# Patient Record
Sex: Female | Born: 1940 | Race: White | Hispanic: No | Smoking: Never smoker
Health system: Southern US, Community
[De-identification: ages and names within clinical notes are randomized; demographics above are authoritative.]

## PROBLEM LIST (undated history)

## (undated) DIAGNOSIS — O1023 Pre-existing hypertensive chronic kidney disease complicating the puerperium: Secondary | ICD-10-CM

## (undated) DIAGNOSIS — K56609 Unspecified intestinal obstruction, unspecified as to partial versus complete obstruction: Secondary | ICD-10-CM

## (undated) DIAGNOSIS — I129 Hypertensive chronic kidney disease with stage 1 through stage 4 chronic kidney disease, or unspecified chronic kidney disease: Secondary | ICD-10-CM

## (undated) DIAGNOSIS — R059 Cough, unspecified: Secondary | ICD-10-CM

## (undated) DIAGNOSIS — N189 Chronic kidney disease, unspecified: Secondary | ICD-10-CM

## (undated) DIAGNOSIS — J45909 Unspecified asthma, uncomplicated: Secondary | ICD-10-CM

## (undated) DIAGNOSIS — M1990 Unspecified osteoarthritis, unspecified site: Secondary | ICD-10-CM

## (undated) DIAGNOSIS — I10 Essential (primary) hypertension: Secondary | ICD-10-CM

## (undated) DIAGNOSIS — F0390 Unspecified dementia without behavioral disturbance: Secondary | ICD-10-CM

## (undated) HISTORY — PX: JOINT REPLACEMENT: SHX530

---

## 2001-03-12 NOTE — ED Provider Notes (Signed)
[facility]                      EMERGENCY DEPARTMENT TREATMENT REPORT   NAME:  [name], [name]   MR #:  [id number]   BILLING #: [id number]        DOS: 03/12/2001  TIME:12:49 P   cc:   Primary Physician:  Alba Cory, M.D.   CHIEF COMPLAINT:  Chest pain with headache.   HISTORY OF PRESENT ILLNESS: This 60 year old white female presents at 12:10   complaining of chest pain that was constant, Monday and Tuesday, described   as pressure at 7/10 with dizziness, diaphoresis, and nausea.  She had seen   her doctor on Monday morning, blood pressure was still high. She was placed   on atenolol and increased her Diovan to two a day. The patient developed   chest pain later that day, no worse with deep inspiration or exertion. She   did have some shortness of breath also.   The pressure became intermittent   after Tuesday, and she notes no modifying factors whatsoever. She also   denies radiation, vomiting, hemoptysis or any other symptoms or injuries.   REVIEW OF SYSTEMS:   CONSTITUTIONAL:  No fever, chills, or weight loss.   HEMATOLOGIC/LYMPHATIC:  No excessive bruising or lymph node swelling.   ALLERGIC/IMMUNOLOGIC:  No urticaria or allergy symptoms.   RESPIRATORY:  No cough, shortness of breath, or wheezing.   CARDIOVASCULAR: The patient denies shortness of breath, dizziness,   diaphoresis, N/V, radiation, paresthesias, hemoptysis, productive cough,   calf symptoms, or loss of consciousness, except as above.   GASTROINTESTINAL:  No vomiting, diarrhea, or abdominal pain.   GENITOURINARY:  No dysuria, frequency, or urgency.   MUSCULOSKELETAL:  No joint pain or swelling.   INTEGUMENTARY:  No rashes.   NEUROLOGICAL:  The patient denies confusion, ataxia, weakness, radiation,   paresthesias, changed vision/speech/hearing, dizziness, loss of   consciousness, or N/V, except as above.   All other systems negative.   PAST MEDICAL HISTORY:  Hypertension, depression, status post knee    replacement.  Negative for MI, diabetes, or high cholesterol.   FAMILY HISTORY:  Negative early cardiac disease.   SOCIAL HISTORY:  No cigarettes.   ALLERGIES:  None.   MEDICATIONS:  Atenolol, Diovan, trazodone, Prozac and estrogen.   VITAL SIGNS:  Blood pressure 179/86, pulse 54, respirations 16, temperature   98.3 .  Pain 7/10 when present.   GENERAL APPEARANCE:  The patient appears well-developed and well-nourished.   Appearance and behavior are age and situation appropriate.   EYES: Conjunctivae clear, lids normal.  Pupils equal, symmetrical, and   normally reactive. Fundi:  Optic discs are normal in appearance; no gross   hemorrhages or exudates seen.   NECK:  Supple, nontender, symmetrical, no masses or JVD, trachea midline,   thyroid not enlarged, nodular, or tender.   RESPIRATORY:  Clear and equal breath sounds.  No respiratory distress,   tachypnea, or accessory muscle use.   HEART:  Regular, without significant murmurs, gallops, rubs, or thrills.   PMI not displaced. Vascular:  Calves soft and nontender.  No peripheral   edema or significant varicosities.  Carotid, femoral, and pedal pulses are   satisfactory.  The abdominal aorta is not palpably enlarged.   CHEST:  Chest symmetrical without masses or tenderness.   GASTROINTESTINAL:  Abdomen soft, nontender, without complaint of pain to   palpation.  No hepatomegaly or splenomegaly. No abdominal or  inguinal   masses appreciated by inspection or palpation.   SKIN:  Warm and dry without rashes.   NEUROLOGIC:  Cranial nerves, deep tendon reflexes, strength, and light   touch sensation are unremarkable.   PSYCHIATRIC:  Judgment appears appropriate. Recent and remote memory appear   to be intact. Oriented to time, place, and person.  Mood and affect   appropriate.   IMPRESSION/MANAGEMENT PLAN:  This is a new problem for this patient. As an   acute illness posing a potential threat to life or bodily function, this is    a high risk presentation necessitating an immediate diagnostic evaluation.   Nursing notes were reviewed.  Patient with chest pain.  Acute ischemic   coronary disease must be considered first, and the patient protected   against the consequences of same, while other etiologies (including   infectious, metabolic, pulmonary, gastrointestinal, and musculoskeletal)   are considered.   The patient states she always gets these headaches when her blood pressure   is up, states it is nothing unusual.   CONTINUATION BY DR. MANOLIO:   DIAGNOSTIC STUDIES:   X-ray was read as negative by the radiologist. An   electrocardiogram showed sinus bradycardia at 50 per minute, no ischemic   changes.  A CBC, BMP, and cardiac enzymes were all normal.   EMERGENCY DEPARTMENT COURSE: The patient was watched on cardiac, blood   pressure and oximetry monitoring and remained in sinus rhythm throughout   her stay.  A saline lock was established and the patient was give Clonidine   0.1 milligrams orally for her blood pressure.   On recheck at 0220 her blood pressure is 117/63, she is asymptomatic   completely, and is ready to go home.  I contacted Dr. Ulla Potash and she   agrees with discharge with the patient calling the office for further   rechecks of her blood pressure and should she have any worsening.   FINAL DIAGNOSIS:   1.  Poorly controlled hypertension.   2.   Chest pain, etiology unclear.   DISPOSITION:  The patient is discharged home in stable condition, with   instructions to follow up with their regular doctor.  They are advised to   return immediately for any worsening or symptoms of concern.   Electronically Signed By:   Imogene Burn, M.D. 03/15/2001 00:24   ____________________________   Imogene Burn, M.D.   ksf/dd  D:  03/12/2001 T:  03/12/2001 12:47 P   [id number]

## 2004-02-08 NOTE — ED Provider Notes (Signed)
[facility]                      EMERGENCY DEPARTMENT TREATMENT REPORT   NAME:  [name], [name]                       PT. LOCATION:     ER  [phone]   MR #:         BILLING #: [id number]          DOS: 02/08/2004   TIME: 1:07 A   [id number]   cc:    JACK L. Henrene Hawking, M.D.          Alba Cory, M.D.   Primary Physician:   CHIEF COMPLAINT:  Knee replacement now with dizziness.   HISTORY OF PRESENT ILLNESS:  The patient is a 63 year old female who had a   right knee replacement done at [facility] April 20 by   Dr. Nicholos Johns.  The patient has been followed by home health nurse at   home.  She noticed the last 2 days she has decreased appetite, feeling   nauseated at times, and is feeling hot, cold, and chills.  Felt lightheaded   when she got up and thought she was going to pass out.  She had an anxiety   attack because of that.  EMS was called.  The patient was transported here.   The patient had a bowel movement today after being constipated for 4 days   after she took magnesium citrate.  She did not see any blood or melena.   PAST MEDICAL HISTORY:  Hypertension, depression, and the left knee was   replaced in the past.   ALLERGIES:  Morphine, sulfate.   MEDICATIONS:  Atenolol, warfarin, hydrochlorothiazide, Diovan, Desyrel,   Carafate, multivitamins.   SOCIAL HISTORY:  Lives at home.   FAMILY HISTORY:  Noncontributory.   REVIEW OF SYSTEMS: ENT: No sore throat, runny nose or other URI symptoms.   HEMATOLOGIC/LYMPHATIC:  No excessive bruising or lymph node swelling.   RESPIRATORY:  No cough, shortness of breath, or wheezing.   CARDIOVASCULAR:  No chest pain, chest pressure, or palpitations.   GASTROINTESTINAL:  No vomiting, diarrhea, or abdominal pain.   GENITOURINARY:  No dysuria, frequency, or urgency.   MUSCULOSKELETAL:  Still has some joint pain from the surgery.   INTEGUMENTARY:  No rashes.   NEUROLOGICAL:  No headaches, sensory or motor symptoms.    Denies complaints in any other system.   PHYSICAL EXAMINATION:   VITAL SIGNS:  Blood pressure 159/75, pulse 76, respirations 18, temperature   93.  The patient stood up, and his blood pressure remained about the same   with minimal change in pulse.  She is not toxic and not dehydrated.   RESPIRATORY:  Clear and equal breath sounds.  No respiratory distress,   tachypnea, or accessory muscle use.   CARDIOVASCULAR:  Heart regular, without murmurs, gallops, rubs, or thrills.   PMI not displaced.   CHEST:  Chest symmetrical without masses or tenderness.   GI:  Abdomen soft, nontender, without complaint of pain to palpation.  No   hepatomegaly or splenomegaly.  No abdominal or inguinal masses appreciated   by inspection or palpation.   Rectal:  No masses or hemorrhoids.  Sphincter tone is normal.  Stool brown,   guaiac negative.   NEUROLOGIC:  Cranial nerves, deep tendon reflexes, strength, and light   touch sensation are unremarkable.  NECK:  Supple.  No meningeal signs.  Sinuses nontender.   PSYCHIATRIC:  Judgment appears appropriate.  Recent and remote memory   appear to be intact. Oriented to time, place and person.  Mood and affect   appropriate.   SKIN:  Shows no rash.  Incision healing well.  No effusion noted.   INITIAL ASSESSMENT AND MANAGEMENT PLAN:  The patient presents here with   feeling lightheaded after recent surgery and will be evaluated for comorbid   complications of this.  She does not have a fever but felt warm earlier.   Nursing notes were reviewed.  Old records reviewed.   CONTINUATION BY DR. KISA:   DIAGNOSTIC TESTING:  PT/INR was 1.6.  Urinalysis negative for infection,   less than 1.005 showing good hydration and specific gravity.  BMP within   normal limits.  EKG showed normal sinus rhythm, no axis deviation, and no   acute abnormalities.  Chest x-ray was negative per Dr. Arvella Merles.  White count   is 9.8, hematocrit 30, and hemoglobin 10.    COURSE IN THE EMERGENCY DEPARTMENT:  The patient was stood up and was not   orthostatic.  She was hydrated here further.  After she drank fluids and   ate a meal, she started feeling fine and denied being dizzy.  She revealed   that today she has only had a couple pieces of toast in the morning along   with some Jell-O in the evening.  She says she just does not have an   appetite.  We emphasized to her that she does need to eat regular meals to   maintain her caloric intake and help in her rehabilitation.  She has a home   health nurse who checks on her regularly.  They will be able to follow with   her.  At this time, she was discharged with a ride expected to arrive here   shortly.   CLINICAL IMPRESSION:      1.  Acute dizziness evaluation.      2. Status post recent surgery with right knee replacement.  The patient      at this time denies any chest pain, shortness of breath, swelling,      edema, or fever.      3. Other past medical problems as noted above.   Electronically Signed By:   Wetzel Bjornstad Arvella Merles, M.D. 02/08/2004 18:26   ____________________________   Wetzel Bjornstad. Arvella Merles, M.D.   mw/gm  D:  02/08/2004  T:  02/08/2004  6:18 A   [id number]

## 2005-07-18 NOTE — Op Note (Signed)
[facility]                                OPERATION REPORT                         SURGEON:  Vista Lawman, M.D.   [facility] Sch[place], C[place] G   E:   MR  [id number]                         DATE:            07/18/2005   #:   Lindley Magnus  [id number]                      PT. LOCATION:   #   PAT Brown Human, M.D.   cc:    Vista Lawman, M.D.   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   POSTOPERATIVE DIAGNOSIS:   Same as above.   PROCEDURE:   Arthrodesis of metacarpophalangeal joint, right thumb.   SURGEON:   Burnell Blanks, M.D.   ANESTHESIA:   General endotracheal anesthesia by [facility]   DESCRIPTION OF PROCEDURE:  After adequate anesthesia was obtained, the   patient's right arm was prepped and draped in the usual sterile fashion.   After exsanguination with an Esmarch, the tourniquet was inflated to 250   mm/HG.  At this point, an incision was made over the metacarpophalangeal   joint and was carried down through subcutaneous tissue.  The extensor hood   was split.  The dorsal capsule was opened and the joint was markedly   arthritic.  The collateral ligaments were released.  The joint was flexed   down.  The high speed bur was then used to decorticate the distal aspect of   the first metacarpal and the proximal aspect of the proximal phalanx.  A   [facility] arthrodesis was then performed with two 0.35 K-wires. The thumb   was straight in regards to varus and valgus in about 5-10 degrees of   flexion.  Clinically, it looked excellent.  The wound was then irrigated   out and the pins were cut on bone.  The capsule was closed with 3-0   Ethibond.  The extensor hood was placed with 3-0 Ethibond.  Tourniquet had   been deflated at 28 minutes.  Hemostasis was obtained. The   skin was closed with 5-0 Monocryl sutures.  A sterile dressing with a thumb   Spica splint was applied  and the patient was taken from OR to the recovery   room in good condition.   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   Electronically Signed By:   Vista Lawman, M.D. 07/18/2005 09:43   _________________________________   Vista Lawman, M.D.   ecc  D:  07/18/2005  T:  07/18/2005  8:49 A   [id number]

## 2005-07-18 NOTE — Op Note (Signed)
[facility]                                OPERATION REPORT                         SURGEON:  Vista Lawman, M.D.   [facility] [place], [place] G   E:   MR  [id number]                         DATE:            07/18/2005   #:   Lindley Magnus  [id number]                      PT. LOCATION:   #   PAT Brown Human, M.D.   cc:    Vista Lawman, M.D.   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   POSTOPERATIVE DIAGNOSIS:   Same as above.   PROCEDURE:   Arthrodesis of metacarpophalangeal joint, right thumb.   SURGEON:   Burnell Blanks, M.D.   ANESTHESIA:   General endotracheal anesthesia by [facility]   DESCRIPTION OF PROCEDURE:  After adequate anesthesia was obtained, the   patient's right arm was prepped and draped in the usual sterile fashion.   After exsanguination with an Esmarch, the tourniquet was inflated to 250   mm/HG.  At this point, an incision was made over the metacarpophalangeal   joint and was carried down through subcutaneous tissue.  The extensor hood   was split.  The dorsal capsule was opened and the joint was markedly   arthritic.  The collateral ligaments were released.  The joint was flexed   down.  The high speed bur was then used to decorticate the distal aspect of   the first metacarpal and the proximal aspect of the proximal phalanx.  A   [facility]-[employer]-[facility] arthrodesis was then performed with two 0.35 K-wires. The thumb   was straight in regards to varus and valgus in about 5-10 degrees of   flexion.  Clinically, it looked excellent.  The wound was then irrigated   out and the pins were cut on bone.  The capsule was closed with 3-0   Ethibond.  The extensor hood was placed with 3-0 Ethibond.  Tourniquet had   been deflated at 28 minutes.  Hemostasis was obtained. The   skin was closed with 5-0 Monocryl sutures.  A sterile dressing with a thumb    Spica splint was applied and the patient was taken from OR to the recovery   room in good condition.   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   Electronically Signed By:   Vista Lawman, M.D. 07/18/2005 09:43   _________________________________   Vista Lawman, M.D.   ecc  D:  07/18/2005  T:  07/18/2005  8:49 A   [id number]

## 2008-08-03 NOTE — Progress Notes (Signed)
[facility]                       PHYSICAL THERAPY INITIAL EVALUATION   PATIENT NAME:  [name], [name]   MR#:  [id number]   DATE:  08/02/2008   BILLING#  [id number]   REFERRING PHYSICIAN:  Princess Perna, DPM   HCFA 700: MEDICARE PLAN OF TREATMENT FOR OUTPATIENT REHABILITATION   PATIENT'S NAME:                 [name]#    HICN   [name], [name]                [id number]       [id number]   TYPE  x PT       OT       SLP                 ONSET: 06/09      SOC: 10/27/0   PRIMARY DIAGNOSIS                     TREATMENT DIAGNOSIS   Bilateral plantar fasciitis           Same   I CERTIFY THE NEED FOR THESE SERVICES        FREQ/DURATION (E.G.3/WK X 4 WK)   FURNISHED UNDER THIS PLAN OF TREATMENT AND   WHILE UNDER MY CARE                          3 X WEEK X    4       WEEKS                                                CERTIFICATION   Please   Sign                                         FROM   Here                                         THROUGH                                                08/02/08                                                11/02/08   PHYSICIAN SIGNATURE:                         PRIOR HOSPITALIZATION, IF   DATE:                                        APPLICABLE  FROM:                         T   PLEASE SIGN AND RETURN PAGE 1 ONLY VIA FAX TO [phone]   SUBJECTIVE   HISTORY OF PRESENT ILLNESS   This patient reports she developed an onset of pain in her heels   approximately 3 months ago.  It initially started with the right heel and   is now affecting the left heel as well.  She states she usually wears very   good footwear in general and has been wearing tennis shoes lately, but it   does not seem to help.  She states her pain increases with excessive   walking and is unable to work out at [employer] like she used to.  She states   she does have custom made orthotics, but is not wearing them on a    consistent basis.  The podiatrist has suggested steroid injections in the   heels, but she wants to put them off as long as possible.   PAIN SCALE RATING   1/10 at best, which is with nonweightbearing, 5-6/10 at worst.   MEDICATIONS   Atenolol, Norvasc, Micardis, Prozac, multivitamin, and Tylenol p.m.   ALLERGIES   None listed.   PRECAUTIONS   None specified on prescription.   PAST MEDICAL HISTORY   Significant for hypertension, rheumatoid arthritis, kidney problems,   bilateral total knee arthroplasties,  bilateral hallux valgus correction   and bunionectomies.   X-RAYS/TESTS   None specified.   OCCUPATIONAL/SOCIAL HISTORY   The patient is retired and enjoys working out/exercising and gardening.   PREVIOUS PHYSICAL THERAPY   None.   PATIENT'S GOALS   To walk and exercise without pain.   OBJECTIVE   RANGE OF MOTION   Bilateral talocrural active range of motion visibly within normal limits.   There is limited great toe extension bilaterally secondary to hallux valgus   correction with pin placement, left greater than right.   PALPATION   Tender plantar fascia attachments at the medial calcaneal tubercles.   POSTURE   In standing: Bilateral flat feet with fallen naviculars are noted, left   greater than right.  Subtalar pronation is also noted.   GAIT   Antalgic.   TODAY'S TREATMENT   Initial evaluation, followed by passive range of motion and stretching to   the calf musculature, plantar fascia and first MTP joints.  Instructed in a   home exercise program consisting of gastroc soleus stretches with a towel,   passive range of motion to the first MTP joints and intrinsic strengthening   via towel crunches.  An illustrated handout was issued.  Also discussed the   importance of compliance with her custom orthotics for arch support.   ASSESSMENT   PROBLEMS: The patient is a 67 year old female with bilateral foot and heel   pain secondary to plantar fasciitis.   GOALS:  To be achieved in 12 visits.    1. Independent with home exercise program and self treatment techniques.   2. Decrease subjective complaints of calcaneal and arch pain by greater      than or equal to 4 decrements.   3. Minimize palpable tenderness of the plantar fascia and its insertion.   REHAB POTENTIAL: Fair.   BARRIERS TO ACHIEVING GOALS: Hypomobility of the toes, especially the first   MTP joints from previous surgeries may hinder her progress.   PLAN: The patient to be  seen by a physical therapist or licensed physical   therapy assistant 3 x a week x 4 weeks.  Treatment to include ultrasound,   iontophoresis, manual therapy, cryotherapy and therapeutic exercise.   Thank you very much for this referral Dr. Dory Peru.  Should you have any   questions or concerns regarding the care of this patient please feel free   to contact me at [phone].   Sincerely,   Unreviewed - Pending Final Signature   CATHERINE JOSEPH, MPT   sl  D:  08/03/2008  T:  08/04/2008  9:24 A   [id number]

## 2008-08-24 LAB — METABOLIC PANEL, COMPREHENSIVE
A-G Ratio: 1 (ref 0.8–1.7)
ALT (SGPT): 37 U/L (ref 30–65)
AST (SGOT): 25 U/L (ref 15–37)
Albumin: 3.9 g/dL (ref 3.4–5.0)
Alk. phosphatase: 90 U/L (ref 50–136)
Anion gap: 8 mmol/L (ref 5–15)
BUN/Creatinine ratio: 16 (ref 12–20)
BUN: 19 MG/DL — ABNORMAL HIGH (ref 7–18)
Bilirubin, total: 0.4 MG/DL (ref 0.1–0.9)
CO2: 31 MMOL/L (ref 21–32)
Calcium: 10 MG/DL (ref 8.4–10.4)
Chloride: 96 MMOL/L — ABNORMAL LOW (ref 100–108)
Creatinine: 1.2 MG/DL (ref 0.6–1.3)
GFR est AA: 57 mL/min/{1.73_m2} — ABNORMAL LOW (ref >60)
GFR est non-AA: 47 mL/min/{1.73_m2} — ABNORMAL LOW (ref >60)
Globulin: 3.8 g/dL (ref 2.0–4.0)
Glucose: 98 MG/DL (ref 74–99)
Potassium: 4.1 MMOL/L (ref 3.5–5.5)
Protein, total: 7.7 g/dL (ref 6.4–8.2)
Sodium: 135 MMOL/L — ABNORMAL LOW (ref 136–145)

## 2008-08-25 LAB — VITAMIN D, 25 HYDROXY: Vitamin D 25-Hydroxy: 46 ng/mL (ref 30–80)

## 2009-02-23 LAB — CBC WITH AUTOMATED DIFF
ABS. EOSINOPHILS: 0.1 10*3/uL (ref 0.0–0.4)
ABS. LYMPHOCYTES: 3.7 10*3/uL — ABNORMAL HIGH (ref 0.8–3.5)
ABS. MONOCYTES: 0.6 10*3/uL (ref 0–1.0)
ABS. NEUTROPHILS: 5.7 10*3/uL (ref 1.8–8.0)
BASOPHILS: 0 % (ref 0–3)
EOSINOPHILS: 1 % (ref 0–5)
HCT: 36.7 % (ref 36.0–46.0)
HGB: 12.2 g/dL (ref 12.0–16.0)
LYMPHOCYTES: 37 % (ref 20–51)
MCH: 33.9 PG (ref 25.0–35.0)
MCHC: 33.2 g/dL (ref 31.0–37.0)
MCV: 102.2 FL — ABNORMAL HIGH (ref 78.0–102.0)
MONOCYTES: 6 % (ref 2–9)
MPV: 10.5 FL — ABNORMAL HIGH (ref 7.4–10.4)
NEUTROPHILS: 56 % (ref 42–75)
PLATELET: 216 10*3/uL (ref 130–400)
RBC: 3.6 M/uL — ABNORMAL LOW (ref 4.10–5.10)
RDW: 13.4 % (ref 11.5–14.5)
WBC: 10.1 10*3/uL (ref 4.5–13.0)

## 2009-02-23 LAB — METABOLIC PANEL, COMPREHENSIVE
A-G Ratio: 1 (ref 0.8–1.7)
ALT (SGPT): 35 U/L (ref 30–65)
AST (SGOT): 22 U/L (ref 15–37)
Albumin: 3.9 g/dL (ref 3.4–5.0)
Alk. phosphatase: 77 U/L (ref 50–136)
Anion gap: 10 mmol/L (ref 5–15)
BUN/Creatinine ratio: 22 — ABNORMAL HIGH (ref 12–20)
BUN: 28 MG/DL — ABNORMAL HIGH (ref 7–18)
Bilirubin, total: 0.3 MG/DL (ref 0.1–0.9)
CO2: 30 MMOL/L (ref 21–32)
Calcium: 10.5 MG/DL — ABNORMAL HIGH (ref 8.4–10.4)
Chloride: 100 MMOL/L (ref 100–108)
Creatinine: 1.3 MG/DL (ref 0.6–1.3)
GFR est AA: 52 mL/min/{1.73_m2} — ABNORMAL LOW (ref >60)
GFR est non-AA: 43 mL/min/{1.73_m2} — ABNORMAL LOW (ref >60)
Globulin: 3.9 g/dL (ref 2.0–4.0)
Glucose: 96 MG/DL (ref 74–99)
Potassium: 4.8 MMOL/L (ref 3.5–5.5)
Protein, total: 7.8 g/dL (ref 6.4–8.2)
Sodium: 140 MMOL/L (ref 136–145)

## 2009-02-23 LAB — AMYLASE: Amylase: 62 U/L (ref 25–115)

## 2009-02-23 LAB — TSH 3RD GENERATION: TSH: 0.66 u[IU]/mL (ref 0.51–6.27)

## 2009-02-24 LAB — VITAMIN D, 25 HYDROXY: Vitamin D 25-Hydroxy: 49 ng/mL (ref 30–80)

## 2009-05-31 LAB — CBC WITH AUTOMATED DIFF
ABS. LYMPHOCYTES: 3.5 10*3/uL (ref 0.8–3.5)
ABS. MONOCYTES: 0.7 10*3/uL (ref 0–1.0)
ABS. NEUTROPHILS: 4.1 10*3/uL (ref 1.8–8.0)
BASOPHILS: 1 % (ref 0–3)
EOSINOPHILS: 2 % (ref 0–5)
HCT: 36 % (ref 36.0–46.0)
HGB: 11.9 g/dL — ABNORMAL LOW (ref 12.0–16.0)
LYMPHOCYTES: 41 % (ref 20–51)
MCH: 33.6 PG (ref 25.0–35.0)
MCHC: 33 g/dL (ref 31.0–37.0)
MCV: 101.5 FL (ref 78.0–102.0)
MONOCYTES: 9 % (ref 2–9)
MPV: 9.6 FL (ref 7.4–10.4)
NEUTROPHILS: 47 % (ref 42–75)
PLATELET: 208 10*3/uL (ref 130–400)
RBC: 3.54 M/uL — ABNORMAL LOW (ref 4.10–5.10)
RDW: 13.7 % (ref 11.5–14.5)
WBC: 8.5 10*3/uL (ref 4.5–13.0)

## 2009-05-31 LAB — METABOLIC PANEL, COMPREHENSIVE
A-G Ratio: 1.1 (ref 0.8–1.7)
ALT (SGPT): 29 U/L — ABNORMAL LOW (ref 30–65)
AST (SGOT): 21 U/L (ref 15–37)
Albumin: 4 g/dL (ref 3.4–5.0)
Alk. phosphatase: 88 U/L (ref 50–136)
Anion gap: 8 mmol/L (ref 5–15)
BUN/Creatinine ratio: 22 — ABNORMAL HIGH (ref 12–20)
BUN: 29 MG/DL — ABNORMAL HIGH (ref 7–18)
Bilirubin, total: 0.4 MG/DL (ref 0.1–0.9)
CO2: 29 MMOL/L (ref 21–32)
Calcium: 10.2 MG/DL (ref 8.4–10.4)
Chloride: 100 MMOL/L (ref 100–108)
Creatinine: 1.3 MG/DL (ref 0.6–1.3)
GFR est AA: 52 mL/min/{1.73_m2} — ABNORMAL LOW (ref >60)
GFR est non-AA: 43 mL/min/{1.73_m2} — ABNORMAL LOW (ref >60)
Globulin: 3.7 g/dL (ref 2.0–4.0)
Glucose: 86 MG/DL (ref 74–99)
Potassium: 4.2 MMOL/L (ref 3.5–5.5)
Protein, total: 7.7 g/dL (ref 6.4–8.2)
Sodium: 137 MMOL/L (ref 136–145)

## 2009-05-31 LAB — LIPID PANEL
CHOL/HDL Ratio: 2.3 (ref 0–5.0)
Cholesterol, total: 184 MG/DL (ref 0–200)
HDL Cholesterol: 80 MG/DL — ABNORMAL HIGH (ref 40–60)
LDL, calculated: 83.4 MG/DL (ref 0–100)
LDL/HDL Ratio: 1
Triglyceride: 103 MG/DL (ref 0–150)
VLDL, calculated: 20.6 MG/DL

## 2009-05-31 LAB — URINALYSIS W/ RFLX MICROSCOPIC
Bilirubin: NEGATIVE
Blood: NEGATIVE
Glucose: NEGATIVE MG/DL
Ketone: NEGATIVE MG/DL
Leukocyte Esterase: NEGATIVE
Nitrites: NEGATIVE
Protein: NEGATIVE MG/DL
Specific gravity: 1.015 (ref 1.003–1.030)
Urobilinogen: 0.2 EU/DL (ref 0.2–1.0)
pH (UA): 7 (ref 5.0–8.0)

## 2009-06-01 LAB — VITAMIN D, 25 HYDROXY: Vitamin D 25-Hydroxy: 40 ng/mL (ref 30–80)

## 2010-08-13 LAB — TYPE AND SCREEN
ABO/Rh: O POS
Antibody Screen: NEGATIVE

## 2010-08-13 LAB — URINALYSIS W/ RFLX MICROSCOPIC
Bilirubin: NEGATIVE
Blood: NEGATIVE
Glucose: NEGATIVE MG/DL
Ketone: NEGATIVE MG/DL
Leukocyte Esterase: NEGATIVE
Nitrites: NEGATIVE
Protein: NEGATIVE MG/DL
Specific gravity: 1.015 (ref 1.003–1.030)
Urobilinogen: 0.2 EU/DL (ref 0.2–1.0)
pH (UA): 7 (ref 5.0–8.0)

## 2010-08-13 LAB — METABOLIC PANEL, COMPREHENSIVE
A-G Ratio: 1.1 (ref 0.8–1.7)
ALT (SGPT): 40 U/L (ref 30–65)
AST (SGOT): 22 U/L (ref 15–37)
Albumin: 4 g/dL (ref 3.4–5.0)
Alk. phosphatase: 85 U/L (ref 50–136)
Anion gap: 4 mmol/L — ABNORMAL LOW (ref 5–15)
BUN/Creatinine ratio: 17 (ref 12–20)
BUN: 20 MG/DL — ABNORMAL HIGH (ref 7–18)
Bilirubin, total: 0.5 MG/DL (ref 0.2–1.0)
CO2: 33 MMOL/L — ABNORMAL HIGH (ref 21–32)
Calcium: 10 MG/DL (ref 8.4–10.4)
Chloride: 100 MMOL/L (ref 100–108)
Creatinine: 1.2 MG/DL (ref 0.6–1.3)
GFR est AA: 57 mL/min/{1.73_m2} — ABNORMAL LOW (ref >60)
GFR est non-AA: 47 mL/min/{1.73_m2} — ABNORMAL LOW (ref >60)
Globulin: 3.8 g/dL (ref 2.0–4.0)
Glucose: 88 MG/DL (ref 74–99)
Potassium: 3.9 MMOL/L (ref 3.5–5.5)
Protein, total: 7.8 g/dL (ref 6.4–8.2)
Sodium: 137 MMOL/L (ref 136–145)

## 2010-08-13 LAB — CBC W/O DIFF
HCT: 37.4 % (ref 35.0–45.0)
HGB: 12.4 g/dL (ref 12.0–16.0)
MCH: 32.8 PG (ref 24.0–34.0)
MCHC: 33.2 g/dL (ref 31.0–37.0)
MCV: 98.9 FL — ABNORMAL HIGH (ref 74.0–97.0)
MPV: 12.2 FL — ABNORMAL HIGH (ref 9.2–11.8)
PLATELET: 242 10*3/uL (ref 135–420)
RBC: 3.78 M/uL — ABNORMAL LOW (ref 4.20–5.30)
RDW: 12.9 % (ref 11.6–14.5)
WBC: 9.3 10*3/uL (ref 4.6–13.2)

## 2010-08-13 LAB — PROTHROMBIN TIME + INR
INR: 0.9 (ref 0.0–1.2)
Prothrombin time: 12.2 s (ref 11.5–15.2)

## 2010-08-13 LAB — TYPE & SCREEN
ABO/Rh(D): O POS
Antibody screen: NEGATIVE

## 2010-08-13 LAB — PTT: aPTT: 25 s (ref 24.6–37.7)

## 2010-08-22 NOTE — Op Note (Unsigned)
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    PROCEDURE PERFORMED  1. Right L5-S1 laminotomy and facetectomy with removal of synovial cyst.  2. Posterolateral L5-S1 fusion using local autograft and Osteocel Plus  allograft mixture.  3. Spinous process plating, L5-S1.  4. Fluoroscopy for localization and instrumentation placement.    BRIEF HISTORY: The patient is a 69 year old woman who I have been seeing in  my office for severe right posterior leg pain. Her imaging study showed a  large synovial cyst at the L5-S1 level on the right side causing severe  nerve root compression. I recommended surgical resection of this cyst and  likely posterolateral fusion due to the amount of facet necessary to be  resected in order to completely remove the cyst. The patient was aware of  the risks and benefits of the procedure and wished to proceed.    PROCEDURE IN DETAIL: The patient was brought to the operating room and  general anesthesia was induced without any difficulty. Following the  induction of anesthesia, she was placed prone on a Jackson table and  positioned appropriately for the procedure. Appropriate antibiotic had been  given, and this was confirmed.    The lumbar area was marked with fluoroscopy and an incision was planned.  This area was prepped and draped in a sterile fashion and injected with 1%  lidocaine containing epinephrine. The lumbar midline incision was then made  and extended down to the lumbosacral fascia. A bilateral takedown of the  lumbar paraspinous musculature was done at L5-S1 using monopolar   electrocautery and Cobb elevators. Self-retaining retractors were placed.  On the right side, the muscular takedown was performed far laterally over  the facet joint.    The diseased facet was easily visually identified and at this point, the  laminotomy and facetectomy portion of the procedure began. An  L5-S1 laminotomy with total facetectomy was performed using a combination  of high-speed drill and Kerrison rongeurs. A large fluid-filled synovial  cyst was identified. The cyst was popped and returned some viscous fluid.  The cyst capsule was then dissected off of the S1 nerve root and thecal sac  using a careful microdissection. All bone around the cyst was also removed  to address any possible compressive elements.    After good decompression had been achieved, the stability of the segment  was assessed using towel clips on the spinous processes. There was some  instability introduced by resection of the facet and therefore, I felt it  was most likely in the patient's best interest to stabilize the segment as  best possible. We had talked about this eventuality preoperatively, and we  very much wanted to avoid having to do a pedicle screw fusion on her.  Therefore, we opted for a spinous process plate construct.    On the left side, the lamina was decorticated with a high-speed drill. The  bone harvested from the decompression on the right side was then mixed with  Osteocel Plus bone growth adjunct and packed over this decorticated area.  An interspinous process plate was then placed between L5  and S1 and locked  into place. A secure fit was achieved. Positioning was checked with AP and  lateral fluoroscopy and found to be good.    At this point, the wound was copiously irrigated and injected with 0.25%  plain Marcaine local anesthetic. The wound was closed with a layer of 0  Vicryl sutures, followed by 2-0 and 3-0 Vicryl subcutaneous sutures, and a   running 4-0 Monocryl subcuticular stitch. The incision was cleaned and  dried, and a sterile dressing was applied. The patient was taken to  postanesthesia care unit for recovery in good condition.             Date:______Time:______Signature________________________________   Nikhil Osei Margit Banda, MD    JJL:wmx  D: 08/22/2010 9:45 A T: 08/22/2010 5:57 P  Job#: [id number] CScriptDoc #: [id number]  cc: Anthoni Geerts Margit Banda, MD

## 2010-08-25 NOTE — Discharge Summary (Unsigned)
[facility]   [address], [place], [place] [address]     DISCHARGE SUMMARY    PATIENT: [name], [name]  MRN: [id number] ADMITTED: 08/22/2010  BILLING: [id number] DISCHARGED: 08/25/2010  ATTENDING: Louetta Hollingshead Margit Banda, MD  DICTATING: Ernesteen Mihalic Margit Banda, MD        PRINCIPAL DISCHARGE DIAGNOSES  1. Status post right L5 to S1 foraminotomy, facetectomy, and fusion.  2. Right L5 to S1 synovial cyst with root compression.  3. Constipation.  4. Hypertension.    ADDITIONAL DIAGNOSES  1. Prior history of left rotator cuff repair in 2001.  2. Cervical spondylosis without myelopathy.    ALLERGIES: NO KNOWN DRUG ALLERGIES.    PRINCIPAL PROCEDURES PERFORMED DURING THIS HOSPITALIZATION  1. Right L5 to S1 laminotomy and facetectomy with removal of synovial  cyst.  2. Posterolateral L5 to S1 fusion using local autograft and Osteocel Plus  allograft mixture.  3. Spinous process plating, L5 to S1.  4. Fluoroscopy for localization and instrumentation placement.    HISTORY OF PRESENT ILLNESS: The patient is a very pleasant 69 year old  female, followed by Dr. Grace Isaac on an outpatient basis, for severe  right posterior leg pain. Imaging studies showed a large synovial cyst at  L5 to S1 level on the right side, causing severe nerve root compression.  Surgical resection of this cyst and likely posterolateral fusion were  recommended. Patient elected to undergo the above-named procedures.    HOSPITAL COURSE: The patient was admitted on 08/22/2010. The patient  underwent the above-named procedure. Patient tolerated procedure well,  remaining hemodynamically stable during her hospitalization, received  appropriate pre and postoperative prophylactic antibiotics, as well as  maintained on sequential compression devices. Patient's surgical incision  site remained clean, dry, and intact without any signs or symptoms of  infection; Steri-Strips clean, dry, and intact, open to air. Site flat,   dry, no hematoma, no signs or symptoms of infection. Patient is able to  ambulate approximately 300 feet, just prior to discharge, with a rolling  walker and Physical Therapy. Patient has bilateral 5/5 motor strength.  Currently with some complaints of constipation, currently on laxative  p.r.n., currently awaiting to have BM. Patient tolerating oral liquids and  diet easily. Voiding without difficulty.    DISCHARGE MEDICATIONS  1. Micardis 80/12.5 mg 1 tablet p.o. daily.  2. Atenolol 100 mg by mouth at bedtime.  3. Amlodipine 2.5 mg by mouth at bedtime.  4. Premarin 0.5 mg p.o. daily.  5. Prozac 20 mg p.o. daily.  6. Calcium plus vitamin D 600 mg 1 p.o. daily.  7. Vitamin D 50,000 units 1 p.o. once every week.  8. Percocet 5/325 mg 1 to 2 tablets p.o. every 4 hours p.r.n. pain.    DISCHARGE INSTRUCTIONS: Patient will be discharged to home, after she has  had a bowel movement. The patient will have Home Health physical therapy  and Home Health check. Patient instructed on lumbar precautions, no  bending, lifting, or twisting. Patient instructed on no tub baths; may  shower, may pat surgical site dry; do not rub. Instructed to follow up in  office in 4 weeks. Call with any questions or concerns regarding her care;  office number is [phone]; again, please call with any questions or  concerns regarding her care. Again, patient will have Home Health safety  check at home, as well as physical therapy.    DISPOSITION: Patient likely stable for discharge to home, with above  discharge instructions and medications  and followup care as dictated  above. Discharge assessment and plan made with Dr. Grace Isaac.        Dictated By: Melburn Popper, FNP-C               Date:______Time:______Signature________________________________   Baptiste Littler Margit Banda, MD    JJL:wmx  D: 08/24/2010 3:09 P T: 08/25/2010 2:21 P  Job #: [id number] CScriptDoc #: [id number]  cc: Maye Hides, MD   Kamayah Pillay Margit Banda, MD   Nicholos Johns

## 2011-07-18 NOTE — Procedures (Signed)
Acquisition Time: 2011-07-19  10:07:45   Total Exercise Time: 51 secs   Test Indications: CHEST PAIN   Medications:   Protocol: LEXISCAN           Max HR: 092 BPM  62% of  Pred: 148 BPM   Max BP: 154/084 mmHG   Max Work Load: 1.0 METS   Conclusion: Please Correlate with Nuclear Report:   Negative ECG Response to IV Regadenoson (Lexiscan)   Conclusion: Please Correlate with Nuclear Report:   Confirmed by Ashby, M.D., Charles Jr. (30) on 07/19/2011 1:53:55 PM   Referred By:             Overread By: Charles J    Ashby, M.D.

## 2011-07-19 NOTE — Procedures (Signed)
[facility]   Nuclear Report   NAME:  Herbster, Fannie   SEX:   F   DATE: 07/19/2011   DOB:   1941-04-18   MR# [id number]   ROOM:     REFERRING:    BILLING#  [id number]   Tommy Medal MD, FACC, FCCP               READ WITH DR. Hassan Rowan   NUCLEAR REPORT:   Tomographic nuclear imaging with attenuation correction and quantitative    analysis demonstrates no significant fixed or transient defects, comparing the    infusion stress study and rest images.  Gated stress study demonstrates    normal wall motion with 0.66 ejection fraction.       OVERALL IMPRESSION:   Normal ECG gated IV pharmacologic SPECT sestamibi nuclear stress test    demonstrates normal left ventricular wall motion and ejection fraction with no    scintigraphic evidence of myocardial scar or ischemia following regadenoson    infusion.           ___________________   Tommy Medal MD, FACC, FCCP   Dictated By: .    KB   D:07/19/2011   T: 07/20/2011 01:32:57   [id number]

## 2011-07-19 NOTE — Procedures (Signed)
Acquisition Time: 2011-07-19  10:07:45   Total Exercise Time: 51 secs   Test Indications: CHEST PAIN   Medications:   Protocol: LEXISCAN           Max HR: 092 BPM  62% of  Pred: 148 BPM   Max BP: 154/084 mmHG   Max Work Load: 1.0 METS   Conclusion: Please Correlate with Nuclear Report:   Negative ECG Response to IV Regadenoson Eugenie Birks)   Conclusion: Please Correlate with Nuclear Report:   Confirmed by Cleon Gustin, M.D., Madelynn Done. (30) on 07/19/2011 1:53:55 PM   Referred By:             Overread By: Merlene Pulling, M.D.

## 2011-07-19 NOTE — Procedures (Signed)
[facility]   Nuclear Report   NAME:  Trautman, Ashleigh   SEX:   F   DATE: 07/19/2011   DOB:   07/01/1941   MR# [id number]   ROOM:     REFERRING:    BILLING#  [id number]   Charles C Ashby MD, FACC, FCCP               READ WITH DR. ARNSTON   NUCLEAR REPORT:   Tomographic nuclear imaging with attenuation correction and quantitative    analysis demonstrates no significant fixed or transient defects, comparing the    infusion stress study and rest images.  Gated stress study demonstrates    normal wall motion with 0.66 ejection fraction.       OVERALL IMPRESSION:   Normal ECG gated IV pharmacologic SPECT sestamibi nuclear stress test    demonstrates normal left ventricular wall motion and ejection fraction with no    scintigraphic evidence of myocardial scar or ischemia following regadenoson    infusion.           ___________________   Charles C Ashby MD, FACC, FCCP   Dictated By: .    KB   D:07/19/2011   T: 07/20/2011 01:32:57   [id number]

## 2012-04-02 NOTE — Procedures (Signed)
Test Reason : Other Reasons-Enter in Comments   Blood Pressure : ***/*** mmHG   Vent. Rate : 054 BPM     Atrial Rate : 054 BPM      P-R Int : 144 ms          QRS Dur : 088 ms       QT Int : 442 ms       P-R-T Axes : 034 -02 001 degrees      QTc Int : 419 ms   Sinus bradycardia   Otherwise normal ECG   When compared with ECG of 11-Jul-2005 11:50,   PREVIOUS ECG IS PRESENT   No significant change since prior tracing   Confirmed by Hyacinth Meeker, M.D., Ramon Dredge (37) on 04/02/2012 5:51:50 PM   Referred By:             Overread By: Berton Redmond, M.D.

## 2012-04-02 NOTE — Procedures (Signed)
Test Reason : Other Reasons-Enter in Comments   Blood Pressure : ***/*** mmHG   Vent. Rate : 054 BPM     Atrial Rate : 054 BPM      P-R Int : 144 ms          QRS Dur : 088 ms       QT Int : 442 ms       P-R-T Axes : 034 -02 001 degrees      QTc Int : 419 ms   Sinus bradycardia   Otherwise normal ECG   When compared with ECG of 11-Jul-2005 11:50,   PREVIOUS ECG IS PRESENT   No significant change since prior tracing   Confirmed by Miller, M.D., Edward (37) on 04/02/2012 5:51:50 PM   Referred By:             Overread By: Edward Miller, M.D.

## 2013-10-29 NOTE — Procedures (Unsigned)
Patient Name: [name] CustardCARMEN  [name]  Account Number: [id number]  Date of Birth:  1941-08-27  Record Number:  [id number]  Date of Procedure: 10/29/2013  Referring Physician(s): Alba CoryLaurie Goldsticker    Endoscopist: Dalia HeadingPaul Ricketts     PROCEDURE PERFORMED         Colonoscopy  INDICATIONS FOR EXAMINATION:    screening, average risk  Instruments:  [id number]  Medications: Propofol per anesthesia department Visualization: Good  Tolerance:  Good Complications:  None Extent of Exam: Cecum  Limitations: None    Specimens:   Classes: ASA Class III and Mallampati Class   II  Estimated Blood Loss:    Procedure Technique: The nature of the procedure including potential   complications such as bleeding, perforation, infection, missed lesions, risk   of cancer in the future and adverse reaction to medications, and alternatives   was discussed with the patient who appeared to understand and signed consent   was obtained.  The patient was connected to the monitoring devices and placed   in the left lateral position. After adequate IV sedation, a digital rectal   exam was performed.  The colonoscope was introduced into the rectum and   advanced without difficulty through the colon to the Cecum. The cecum was   identified by typical landmarks including ileocecal valve and appendiceal   orifice.  A careful examination was performed as the scope was withdrawn. In   the rectum, the scope was retroflexed.  The colon was then decompressed and   the scope was removed.  The patient tolerated the procedure well and there   were no apparent complications.  The rectal exam was normal.  The quality of   the prep was good.  FINDINGS:  Scope was advanced the cecum with some difficulty.  Colon anatomy was   tortuous.  The colon mucosa was normal appearing throughout.   No polyps or masses were seen.  Internal hemorrhoids were seen on retroflexion in the rectum.    ENDOSCOPIC DIAGNOSIS:  Internal hemorrhoids  Otherwise normal colonoscopy    RECOMMENDATIONS:   High fiber diet  No future screening colonoscopies based on age.  Follow up with primary care physician        Signature:_________________________________ Dalia HeadingPaul Ricketts , M.D.     This procedure was electronically signed off on(Endoscopy): 10/29/2013 1:35:56   PM by Dalia HeadingPaul Ricketts,  M.D.

## 2014-12-19 ENCOUNTER — Encounter

## 2014-12-22 ENCOUNTER — Inpatient Hospital Stay: Admit: 2014-12-22 | Payer: MEDICARE | Attending: Family Medicine | Primary: Family Medicine

## 2014-12-22 DIAGNOSIS — N189 Chronic kidney disease, unspecified: Secondary | ICD-10-CM

## 2015-01-10 ENCOUNTER — Observation Stay: Admit: 2015-01-11 | Payer: MEDICARE | Primary: Family Medicine

## 2015-01-10 ENCOUNTER — Inpatient Hospital Stay
Admit: 2015-01-10 | Discharge: 2015-01-10 | Disposition: A | Discharge status: Home / Self Care | Payer: MEDICARE | Attending: Emergency Medicine

## 2015-01-10 ENCOUNTER — Emergency Department: Admit: 2015-01-10 | Payer: MEDICARE | Primary: Family Medicine

## 2015-01-10 ENCOUNTER — Encounter: Primary: Family Medicine

## 2015-01-10 DIAGNOSIS — K529 Noninfective gastroenteritis and colitis, unspecified: Secondary | ICD-10-CM

## 2015-01-10 LAB — CBC WITH AUTOMATED DIFF
BASOPHILS: 0.3 % (ref 0–3)
EOSINOPHILS: 0.2 % (ref 0–5)
HCT: 40.3 % (ref 37.0–50.0)
HGB: 13.2 gm/dl (ref 13.0–17.2)
IMMATURE GRANULOCYTES: 0.4 % (ref 0.0–3.0)
LYMPHOCYTES: 27.7 % — ABNORMAL LOW (ref 28–48)
MCH: 32.4 pg (ref 25.4–34.6)
MCHC: 32.8 gm/dl (ref 30.0–36.0)
MCV: 99 fL — ABNORMAL HIGH (ref 80.0–98.0)
MONOCYTES: 9.8 % (ref 1–13)
MPV: 12.6 fL — ABNORMAL HIGH (ref 6.0–10.0)
NEUTROPHILS: 61.6 % (ref 34–64)
NRBC: 0 (ref 0–0)
PLATELET: 197 10*3/uL (ref 140–450)
RBC: 4.07 M/uL (ref 3.60–5.20)
RDW-SD: 43.7 (ref 36.4–46.3)
WBC: 15.4 10*3/uL — ABNORMAL HIGH (ref 4.0–11.0)

## 2015-01-10 LAB — POC CHEM8
BUN: 53 mg/dl — ABNORMAL HIGH (ref 7–25)
CALCIUM,IONIZED: 4.7 mg/dL (ref 4.40–5.40)
CO2, TOTAL: 24 mmol/L (ref 21–32)
Chloride: 96 mEq/L — ABNORMAL LOW (ref 98–107)
Creatinine: 2.6 mg/dl — ABNORMAL HIGH (ref 0.6–1.3)
Glucose: 107 mg/dL — ABNORMAL HIGH (ref 74–106)
HCT: 45 % (ref 38–45)
HGB: 15.3 gm/dl (ref 12.4–17.2)
Potassium: 3.5 mEq/L (ref 3.5–4.9)
Sodium: 136 mEq/L (ref 136–145)

## 2015-01-10 LAB — HEPATIC FUNCTION PANEL
ALT (SGPT): 28 U/L (ref 12–78)
AST (SGOT): 28 U/L (ref 15–37)
Albumin: 4.1 gm/dl (ref 3.4–5.0)
Alk. phosphatase: 78 U/L (ref 45–117)
Bilirubin, direct: 0.3 mg/dl — ABNORMAL HIGH (ref 0.0–0.2)
Bilirubin, total: 0.9 mg/dl (ref 0.2–1.0)
Protein, total: 8.3 gm/dl — ABNORMAL HIGH (ref 6.4–8.2)

## 2015-01-10 LAB — POC FECAL OCCULT BLOOD: Occult blood, stool: NEGATIVE — AB

## 2015-01-10 LAB — LIPASE: Lipase: 96 U/L (ref 73–393)

## 2015-01-10 MED ORDER — PANTOPRAZOLE 40 MG IV SOLR
40 mg | INTRAVENOUS | Status: DC
Start: 2015-01-10 — End: 2015-01-12
  Administered 2015-01-10 – 2015-01-11 (×3): via INTRAVENOUS

## 2015-01-10 MED ORDER — DIATRIZOATE MEGLUMINE & SODIUM 66 %-10 % ORAL SOLN
66-10 % | Freq: Once | ORAL | Status: AC
Start: 2015-01-10 — End: 2015-01-10
  Administered 2015-01-10: 23:00:00 via ORAL

## 2015-01-10 MED ORDER — SUCRALFATE 100 MG/ML ORAL SUSP
100 mg/mL | ORAL | Status: AC
Start: 2015-01-10 — End: 2015-01-10
  Administered 2015-01-10: 20:00:00 via ORAL

## 2015-01-10 MED ORDER — SODIUM CHLORIDE 0.9 % INJECTION
40 mg | INTRAMUSCULAR | Status: AC
Start: 2015-01-10 — End: 2015-01-10
  Administered 2015-01-10: 18:00:00 via INTRAVENOUS

## 2015-01-10 MED ORDER — SODIUM CHLORIDE 0.9 % IV
INTRAVENOUS | Status: DC
Start: 2015-01-10 — End: 2015-01-10
  Administered 2015-01-11: via INTRAVENOUS

## 2015-01-10 MED ORDER — SODIUM CHLORIDE 0.9 % IJ SYRG
Freq: Once | INTRAMUSCULAR | Status: AC
Start: 2015-01-10 — End: 2015-01-10
  Administered 2015-01-10: 19:00:00 via INTRAVENOUS

## 2015-01-10 MED ORDER — ONDANSETRON (PF) 4 MG/2 ML INJECTION
4 mg/2 mL | INTRAMUSCULAR | Status: DC | PRN
Start: 2015-01-10 — End: 2015-01-12

## 2015-01-10 MED ORDER — SODIUM CHLORIDE 0.9 % IV
40 mg | INTRAVENOUS | Status: DC
Start: 2015-01-10 — End: 2015-01-10

## 2015-01-10 MED ORDER — ONDANSETRON (PF) 4 MG/2 ML INJECTION
4 mg/2 mL | Freq: Once | INTRAMUSCULAR | Status: AC
Start: 2015-01-10 — End: 2015-01-10
  Administered 2015-01-10: 18:00:00 via INTRAVENOUS

## 2015-01-10 MED ORDER — CAFFEINE-SODIUM BENZOATE 250 MG/ML IJ SOLN
250 mg/mL (125 mg/mL caffeine) | INTRAMUSCULAR | Status: DC
Start: 2015-01-10 — End: 2015-01-10
  Administered 2015-01-10: 17:00:00 via INTRAVENOUS

## 2015-01-10 MED ORDER — SODIUM CHLORIDE 0.9% BOLUS IV
0.9 % | INTRAVENOUS | Status: AC
Start: 2015-01-10 — End: 2015-01-10
  Administered 2015-01-10: 18:00:00 via INTRAVENOUS

## 2015-01-10 MED FILL — PROTONIX 40 MG INTRAVENOUS SOLUTION: 40 mg | INTRAVENOUS | Qty: 80

## 2015-01-10 MED FILL — BD POSIFLUSH NORMAL SALINE 0.9 % INJECTION SYRINGE: INTRAMUSCULAR | Qty: 10

## 2015-01-10 MED FILL — SODIUM CHLORIDE 0.9 % INJECTION: INTRAMUSCULAR | Qty: 20

## 2015-01-10 MED FILL — SUCRALFATE 100 MG/ML ORAL SUSP: 100 mg/mL | ORAL | Qty: 10

## 2015-01-10 MED FILL — ONDANSETRON (PF) 4 MG/2 ML INJECTION: 4 mg/2 mL | INTRAMUSCULAR | Qty: 2

## 2015-01-10 MED FILL — MD-GASTROVIEW 66 %-10 % ORAL SOLUTION: 66-10 % | ORAL | Qty: 30

## 2015-01-10 MED FILL — SODIUM CHLORIDE 0.9 % IV: INTRAVENOUS | Qty: 1000

## 2015-01-10 NOTE — Consults (Signed)
Chart reviewed.    Patient admitted with elevated Cr level.  It does not appear she has CKD although I can't see any labs between present time and 2011 (Cr was relatively normal back then).    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      Agree with present management including hydrating with NS and holding micardisHCT.    I'll hold off on ordering diagnostic studies for now, we'll see how kidneys respond to fluids first.  Hopefully kidneys will turn around quickly with fluids.    Thanks, will follow closely with you.

## 2015-01-10 NOTE — Other (Signed)
Bedside and Verbal shift change report given to Ardine EngJennifer RN (oncoming nurse) by GrenadaBrittany RN (offgoing nurse). Report included the following information SBAR, ED Summary, Procedure Summary, Intake/Output, MAR, Recent Results and Cardiac Rhythm SR.

## 2015-01-10 NOTE — Other (Signed)
TRANSFER - OUT REPORT:    Verbal report given to Brittany,RN on [name]  being transferred to 2203(unit) for routine progression of care       Report consisted of patient???s Situation, Background, Assessment and   Recommendations(SBAR).     Information from the following report(s) SBAR was reviewed with the receiving nurse.    Lines:   Peripheral IV 01/10/15 Right Antecubital (Active)   Site Assessment Clean, dry, & intact 01/10/2015  1:44 PM   Phlebitis Assessment 0 01/10/2015  1:44 PM   Infiltration Assessment 0 01/10/2015  1:44 PM   Dressing Status Clean, dry, & intact 01/10/2015  1:44 PM   Dressing Type Transparent 01/10/2015  1:44 PM   Action Taken Blood drawn 01/10/2015  1:44 PM        Opportunity for questions and clarification was provided.      Patient transported with:   [employer]

## 2015-01-10 NOTE — ED Notes (Signed)
Patient complains of abdominal pain that started yesterday morning, diarrhea, nausea, and she made herself throw up and thinks there may have been blood in her vomit.

## 2015-01-10 NOTE — ED Provider Notes (Signed)
[facility]  EMERGENCY DEPARTMENT TREATMENT REPORT  NAME:  [name], [name]  SEX:   F  ADMIT: 01/10/2015  DOB:   10-23-1940  MR#    [id number]  ROOM:  2203  TIME DICTATED: 08 34 PM  ACCT#  [id number]    cc: Alba Cory M.D.    TIME OF EVALUATION:     12:34 p.m.    PRIMARY CARE PHYSICIAN:  Alba Cory, MD    CHIEF COMPLAINT:  Vomiting.    HISTORY OF PRESENT ILLNESS:  This is a 74 year old female, nauseous with watery stools over the last 3 to 4   days.  She has had left-sided abdominal pain over this time period.  Today,   because of the pain and because of the nausea, she induced vomiting by gagging   herself, and when she vomited she states it would appeared to all be bright   red blood.  She has not eaten or had anything to drink that she felt could   have caused red vomitus, and presents for further evaluation.  The pain is   slightly improved at this time.    REVIEW OF SYSTEMS:  CONSTITUTIONAL:  No fevers.  EYES:   No visual symptoms.  ENT:  No sore throat, runny nose, or other URI symptoms.    RESPIRATORY:  No cough, shortness of breath, or wheezing.  CARDIOVASCULAR:  No chest pain, chest pressure, or palpitations.  GASTROINTESTINAL:  As above.  CONSTITUTIONAL:  No fever, chills, or weight loss.  MUSCULOSKELETAL:  No joint pain or swelling.   INTEGUMENTARY:  No rashes.  NEUROLOGIC:  The patient feels lightheaded as though she is somewhat weak.     She denies presyncope.  She denies unilateral weakness, slurred speech, facial   droop or headache.  She denies vertigo.    PAST MEDICAL HISTORY:  Hypertension.    PAST SURGICAL HISTORY:     Knee replacement, rotator cuff repair, hysterectomy.    SOCIAL HISTORY:  Nonsmoker, social ETOH occasional.  No drug use.    FAMILY HISTORY:  No known history of MI.    ALLERGIES:  NONE.    MEDICATIONS:  Reviewed in Epic.  The patient occasionally takes an aspirin for  headache but   has not done so in the last 2 days.  No other anticoagulant or  antiplatelet   medication.      PHYSICAL EXAMINATION:  VITAL SIGNS:  Blood pressure 103/64, pulse 73, respirations 18, temperature   97.3, pain 6 out of 10, O2 sats 97% on room air.  GENERAL APPEARANCE:  Patient appears well developed and well nourished.    Appearance and behavior are age and situation appropriate.  Eyes:    Conjunctivae clear, lids normal.  Pupils equal, symmetrical, and normally   reactive.   Ears/Nose:  Hearing is grossly intact to voice.  Internal and   external examinations of the ears and nose are unremarkable.  Mouth/Throat:    Surfaces of the pharynx, palate, and tongue are pink, moist, and without   lesions.  Nasal mucosa, septum, and turbinates unremarkable.  Teeth and gums   unremarkable.      RESPIRATORY:  Clear and equal breath sounds.  No respiratory distress,   tachypnea, or accessory muscle use.    CARDIOVASCULAR:   Heart regular, without murmurs, gallops, rubs, or thrills.       GASTROINTESTINAL:   Abdomen soft.   Left upper tenderness.  No left lower   tenderness, no rebound, no guarding,  no CVA or flank tenderness.  No other   abdominal tenderness.  RECTAL:   With RN at bedside rectal examination showed no masses or   hemorrhoids.  Sphincter tone is normal.  Stool light brown, guaiac negative.   MUSCULOSKELETAL:   There is no localized cervical, thoracic, lumbar or sacral   body tenderness to palpation or fist percussion.  There are no bony step-offs,   ecchymosis, areas of soft tissue swelling or deformities.    SKIN:  Warm and dry without rashes.   NEUROLOGIC:  Alert, oriented.  Sensation intact, motor strength equal and   symmetric.  There is no facial asymmetry or dysarthria.    Cranial nerves   II-XII intact.  No truncal ataxia.    INITIAL ASSESSMENT AND MANAGEMENT PLAN:  We will obtain screening x-ray for perforation of viscus.   Screening GI   laboratory studies, medicate symptomatology, provide IV fluids.  She, I    suspect has hematemesis.   She does have diarrhea as well.  Differential   includes colitis, although I suspect less likely given that the patient is so   comfortable on examination with minimal pain, and improved pain today.    DIAGNOSTIC STUDIES:  X-ray abdomen showed nonspecific bowel gas pattern, no free air or   obstruction, etc.   She had postoperative changes, probable phlebolith lower   pelvis.  Normal heart, aorta, lungs without consolidation.   An i-STAT Chem-8   showed hemoglobin and hematocrit of 15.3 and 45, creatinine at 2.6, BUN 53,   glucose 107, CO2 of 24, chloride 96, otherwise normal.  CBC:  White count   15.4, MCV of 99.  MPV 12.6, lymphocytes 27.7%.  Lipase normal.   Hepatic   function panel showed protein 8.3, bilirubin 0.3, otherwise normal.  CT   abdomen and pelvis, results pending at time of this dictation.    COURSE:  The patient stable in the Emergency Department, did not develop other   symptoms.  Dr. Jimmey Ralph spoke with on call [facility] hospitalist, Dr. Corinda Gubler, who   agreed to admit to their service under telemetry for observation of   hematemesis.   Dr. Corinda Gubler asked Dr. Jimmey Ralph to place the order for CT scan and   Dr. Corinda Gubler stated he would follow up on the results of this study with pending   decisions such as GI consultation, etc. on the results of this study.  The   patient was somewhat reluctant to stay.  She was concerned that she would need   to go home and take care of her dog.  We explained to her our concerns in   terms of his GI bleeding and she eventually agreed to stay.    FINAL DIAGNOSES:     1.  Hematemesis with nausea.  2.  Gastritis.  3.  Abdominal pain, epigastric.    DISPOSITION AND TREATMENT PLAN:  Admission hospital observation telemetry for further management of symptoms.    The patient was personally evaluated by myself and Dr. Floyce Stakes,  who   agrees with the above assessment and plan.        CONTINUATION BY Floyce Stakes, MD:       I interviewed and examined the patient.  I discussed with the mid-level   provider and agree with their evaluation and plan as documented here.       EMERGENCY DEPARTMENT COURSE:  This is a 74 year old female who has been nauseous with watery stools over the   last  3 to 4 days.  She has been having some left upper abdominal pain since   then.  She says the nausea was increasing, so she made herself vomit.  As   such, we checked basic labs on her which were generally unremarkable.  I   obtained an acute abdominal series which shows no evidence of obstruction, no   evidence of free air.  There is a nonspecific bowel gas pattern.  She was   started on a Protonix drip after giving a Protonix bolus, given IV fluids.    She was feeling better.  At this point we did type and screen her.  I am going   to admit her for presumed upper GI bleed and given that the patient has a   benign abdominal exam at this point, I do not believe a CT scan is indicated.    However, when I spoke with the admitting physician, he requested that we get   a CT scan as a precaution, which I did order.  Dr. Corinda GublerVerma is going to follow   the results.      INITIAL ASSESSMENT:  Upper gastrointestinal bleed and given that the patient has a benign abdominal   exam.  At this point, I do not believe a CT scan is indicated; however, when   I spoke with the admitting physician and she requested that we get a CT scan   as a precaution, which I did order.  Dr. Corinda GublerVerma is going to follow the results.         CONTINUATION BY Tracer Gutridge, MD:      EMERGENCY DEPARTMENT COURSE:  Prior to leaving for my shift, [facility]   has a possible small bowel obstruction without a definite transition point.    The patient was already admitted upstairs in the bed.  I called and spoke with   Dr. Langston MaskerMorris who is the hospitalist taking care of the patient and relayed the   results to him.  I printed out a copy of the [facility] report and gave him a    copy.  He is going to place an NG tube and make appropriate consultations for   her.      ___________________  Johny Drillingodd A Shizuko Wojdyla MD  Dictated By: Mearl LatinBryan P. Lockie ParesHendrick, PA-C    My signature above authenticates this document and my orders, the final  diagnosis (es), discharge prescription (s), and instructions in the [facility] record.  Nursing notes have been reviewed by the physician/mid-level provider.    If you have any questions please contact [phone].    [facility]  D:01/10/2015 20:34:54  T: 01/10/2015 22:35:27  [id number]

## 2015-01-10 NOTE — H&P (Signed)
[facility]  History and Physical  NAME:  [name], [name]  SEX:   F  ADMIT: 01/10/2015  DOB:03/27/1941  MR#    [id number]  ROOM:  2203  ACCT#  [id number]    I hereby certify this patient for admission based upon medical necessity as   noted below:    <cc: Alba CoryLAURIE GOLDSTICKER M.D.    HISTORY OF PRESENT ILLNESS:    The patient is a 74 year old female who is coming in with abdominal pain that   started yesterday morning, diarrhea, nausea.  She says that for the last few   days she has not been feeling well and has had no appetite and not eating at   all.  She goes to Dr. Ulla PotashGoldsticker who is her PCP who sent her over to the   hospital today.  She has not had any other major issues related to GI, has not   been on any new medication, has not been eating anything new.  Other than   hypertension and recently being told that she has kidney disease, she has no   other issues.  She has had endoscopy done in 10/2013, colonoscopy which showed   hemorrhoids only.    PAST SURGICAL HISTORY:  L5-S1 foraminotomy and fasciectomy and fusion of the spine, total knee   replacement, carpal tunnel disease, hysterectomy.    MEDICAL HISTORY:  Cervical spine arthritis, depression, osteoarthritis, recently found to have   kidney failure.    FAMILY HISTORY:  Rheumatoid arthritis in the brothers, cancer, kidney disease in the mother.      SOCIAL HISTORY:  Never a smoker.  Does not abuse alcohol or drugs.    ALLERGIES:  NO KNOWN DRUG ALLERGIES.    MEDICATIONS:  Atenolol 100 daily, amlodipine 2.5 and telmisartan/hydrochlorothiazide 80/12.5   daily.    LABORATORY DATA:  White count of 15.4, H&H 13.2 and 40.3, platelets 197.  Sodium 136, potassium   3.5, chloride 93, bicarbonate 24, glucose 107, BUN 53, creatinine 2.6.    Bilirubin is 0.3, protein 8.3.  X-ray abdomen shows no bowel obstruction,   postsurgical change to lower spine.  She has had abdominal duplex done which    showed no evidence of renal artery stenosis, patent bilateral renal veins.    PHYSICAL EXAMINATION:  VITAL SIGNS:  Blood pressure, last, 104/67, pulse 60, respiration 17,   saturating 100%.  GENERAL:  She is in no apparent distress.  HEENT:  Normocephalic, atraumatic.  CHEST:  Air entry equal, no wheeze.  CARDIOVASCULAR:  S1, S2, no murmurs.  ABDOMEN:  Mild tenderness all over the abdomen.  Bowel sounds hypoactive.  No   signs of peritonitis.  MUSCULOSKELETAL:  No joint swelling.  VASCULAR:  Normal dorsalis pedis, posterior tibial pulse.  No signs of   ischemia or gangrene.  NEUROLOGIC:  Alert and oriented x3, able to move all 4 extremities.  PSYCHIATRIC:  Normal affect.    ASSESSMENT:  1.  Acute gastroenteritis.  2.  Acute kidney failure, likely on chronic kidney disease.  3.  Hypertension.  4.  Back pain.    PLAN:  1.  We will keep her on gentle hydration with IV fluids.  2.  Will do nephrology consult considering worsening renal failure.  3.  We will probably need a urine exam and also urine studies to evaluate   this.  She recently had a renal artery ultrasound done which was negative for   any obstruction.  4.  Will hold off on antihypertensive  agents, especially Norvasc,   telmisartan/hydrochlorothiazide.  5.  Will check her stool for C. diff and any other infection.  We will keep   her hydrated and pain controlled.  We will also order a CT scan of the abdomen   to look for any pathology and will follow up on the same.    6.  For DVT prophylaxis, SCDs and heparin subcutaneous.    Plan discussed with the patient.  Total time spent, more than 50% examination   and discussion with patient is 48 minutes.      ___________________  Roderic Scarce MD  Dictated By: .   AC  D:01/10/2015 17:19:05  T: 01/10/2015 17:58:46  [id number]

## 2015-01-11 ENCOUNTER — Observation Stay: Admit: 2015-01-11 | Payer: MEDICARE | Primary: Family Medicine

## 2015-01-11 LAB — CBC WITH AUTOMATED DIFF
BASOPHILS: 0.4 % (ref 0–3)
EOSINOPHILS: 1.1 % (ref 0–5)
HCT: 32.4 % — ABNORMAL LOW (ref 37.0–50.0)
HGB: 10.7 gm/dl — ABNORMAL LOW (ref 13.0–17.2)
IMMATURE GRANULOCYTES: 0.3 % (ref 0.0–3.0)
LYMPHOCYTES: 31.3 % (ref 28–48)
MCH: 32.7 pg (ref 25.4–34.6)
MCHC: 33 gm/dl (ref 30.0–36.0)
MCV: 99.1 fL — ABNORMAL HIGH (ref 80.0–98.0)
MONOCYTES: 11 % (ref 1–13)
MPV: 12.9 fL — ABNORMAL HIGH (ref 6.0–10.0)
NEUTROPHILS: 55.9 % (ref 34–64)
NRBC: 0 (ref 0–0)
PLATELET: 166 10*3/uL (ref 140–450)
RBC: 3.27 M/uL — ABNORMAL LOW (ref 3.60–5.20)
RDW-SD: 44.1 (ref 36.4–46.3)
WBC: 12.2 10*3/uL — ABNORMAL HIGH (ref 4.0–11.0)

## 2015-01-11 LAB — METABOLIC PANEL, BASIC
BUN: 46 mg/dl — ABNORMAL HIGH (ref 7–25)
CO2: 24 mEq/L (ref 21–32)
Calcium: 8.3 mg/dl — ABNORMAL LOW (ref 8.5–10.1)
Chloride: 106 mEq/L (ref 98–107)
Creatinine: 2.3 mg/dl — ABNORMAL HIGH (ref 0.6–1.3)
GFR est AA: 27
GFR est non-AA: 22
Glucose: 119 mg/dl — ABNORMAL HIGH (ref 74–106)
Potassium: 3.1 mEq/L — ABNORMAL LOW (ref 3.5–5.1)
Sodium: 138 mEq/L (ref 136–145)

## 2015-01-11 MED ORDER — PANTOPRAZOLE 40 MG IV SOLR
40 mg | INTRAVENOUS | Status: DC
Start: 2015-01-11 — End: 2015-01-12

## 2015-01-11 MED ORDER — CEFTRIAXONE 1 GRAM SOLUTION FOR INJECTION
1 gram | INTRAMUSCULAR | Status: DC
Start: 2015-01-11 — End: 2015-01-11
  Administered 2015-01-11: 03:00:00 via INTRAVENOUS

## 2015-01-11 MED ORDER — MORPHINE 2 MG/ML INJECTION
2 mg/mL | INTRAMUSCULAR | Status: DC | PRN
Start: 2015-01-11 — End: 2015-01-12

## 2015-01-11 MED ORDER — ATENOLOL 50 MG TAB
50 mg | Freq: Every day | ORAL | Status: DC
Start: 2015-01-11 — End: 2015-01-12
  Administered 2015-01-11 – 2015-01-12 (×2): via ORAL

## 2015-01-11 MED ORDER — ACETAMINOPHEN 325 MG TABLET
325 mg | Freq: Four times a day (QID) | ORAL | Status: DC | PRN
Start: 2015-01-11 — End: 2015-01-12

## 2015-01-11 MED ORDER — SODIUM CHLORIDE 0.9 % IV
INTRAVENOUS | Status: AC
Start: 2015-01-11 — End: 2015-01-11
  Administered 2015-01-11: 02:00:00 via INTRAVENOUS

## 2015-01-11 MED ORDER — SODIUM CHLORIDE 0.9 % IJ SYRG
INTRAMUSCULAR | Status: DC | PRN
Start: 2015-01-11 — End: 2015-01-12

## 2015-01-11 MED ORDER — ALUM-MAG HYDROXIDE-SIMETH 200 MG-200 MG-20 MG/5 ML ORAL SUSP
200-200-20 mg/5 mL | ORAL | Status: DC | PRN
Start: 2015-01-11 — End: 2015-01-12
  Administered 2015-01-11: 15:00:00 via ORAL

## 2015-01-11 MED ORDER — HEPARIN (PORCINE) 5,000 UNIT/ML IJ SOLN
5000 unit/mL | Freq: Two times a day (BID) | INTRAMUSCULAR | Status: DC
Start: 2015-01-11 — End: 2015-01-12
  Administered 2015-01-11 – 2015-01-12 (×3): via SUBCUTANEOUS

## 2015-01-11 MED ORDER — SODIUM CHLORIDE 0.9 % IJ SYRG
Freq: Three times a day (TID) | INTRAMUSCULAR | Status: DC
Start: 2015-01-11 — End: 2015-01-12
  Administered 2015-01-11 – 2015-01-12 (×5): via INTRAVENOUS

## 2015-01-11 MED ORDER — AMLODIPINE 2.5 MG TAB
2.5 mg | Freq: Every day | ORAL | Status: DC
Start: 2015-01-11 — End: 2015-01-12
  Administered 2015-01-11 – 2015-01-12 (×2): via ORAL

## 2015-01-11 MED ORDER — TELMISARTAN 40 MG TAB
40 mg | Freq: Every day | ORAL | Status: DC
Start: 2015-01-11 — End: 2015-01-11

## 2015-01-11 MED ORDER — NALOXONE 0.4 MG/ML INJECTION
0.4 mg/mL | INTRAMUSCULAR | Status: DC | PRN
Start: 2015-01-11 — End: 2015-01-12

## 2015-01-11 MED ORDER — ALBUTEROL SULFATE 2.5 MG/0.5 ML NEB SOLUTION
2.5 mg/0.5 mL | RESPIRATORY_TRACT | Status: DC | PRN
Start: 2015-01-11 — End: 2015-01-12

## 2015-01-11 MED FILL — AMLODIPINE 2.5 MG TAB: 2.5 mg | ORAL | Qty: 1

## 2015-01-11 MED FILL — PROTONIX 40 MG INTRAVENOUS SOLUTION: 40 mg | INTRAVENOUS | Qty: 80

## 2015-01-11 MED FILL — HEPARIN (PORCINE) 5,000 UNIT/ML IJ SOLN: 5000 unit/mL | INTRAMUSCULAR | Qty: 1

## 2015-01-11 MED FILL — SODIUM CHLORIDE 0.9 % IV: INTRAVENOUS | Qty: 1000

## 2015-01-11 MED FILL — ATENOLOL 50 MG TAB: 50 mg | ORAL | Qty: 2

## 2015-01-11 MED FILL — MAG-AL PLUS 200 MG-200 MG-20 MG/5 ML ORAL SUSPENSION: 200-200-20 mg/5 mL | ORAL | Qty: 30

## 2015-01-11 MED FILL — SODIUM CHLORIDE 0.9 % IV PIGGY BACK: INTRAVENOUS | Qty: 50

## 2015-01-11 MED FILL — BD POSIFLUSH NORMAL SALINE 0.9 % INJECTION SYRINGE: INTRAMUSCULAR | Qty: 10

## 2015-01-11 NOTE — Progress Notes (Signed)
Bedside and Verbal shift change report given to [facility] Eveline KetoM WILDER, RN   (oncoming nurse) by Andria Rheinami N RN (offgoing nurse). Report included the following information SBAR and Med Rec Status.

## 2015-01-11 NOTE — Consults (Signed)
[name]  [facility]ATION [facility]  NAME:  [name], [name]  SEX:   F  ADMIT: 01/10/2015  DATE OF CONSULT: 01/11/2015  REFERRING PHYSICIAN:    DOB: September 15, 1941  MR#    [id number]  ROOM:  2203  ACCT#  [id number]        DATE OF CONSULTATION:  01/11/2015    REASON FOR CONSULTATION:  Nausea, self-induced emesis, epigastric abdominal pain and diarrhea of 48   hours duration.    HISTORY OF PRESENT ILLNESS:  This is a 74 year old [place] female, who 48 hours prior to her admission   developed diarrhea, which was followed by burning epigastric pain and nausea,   for which the patient self-induced emesis on one occasion.  The patient notes   that she has had anorexia during this period of time and continues to have   diarrhea, although her abdominal pain has resolved.  [facility]   [facility]2015 and as part of her evaluation, underwent an abdominal and pelvic CT   scan with the notation of bowel loops measuring up to 3 cm in caliber, for   which the concern of bowel obstruction on interpretation was communicated and   general surgical consultation is obtained in this regard.    ALLERGIES:  NO KNOWN DRUG ALLERGIES.    ADMISSION MEDICATIONS:  Atenolol 100 mg daily, Norvasc 2.5 mg daily, telmisartan/hydrochlorothiazide   80/12.5 mg daily.    PAST MEDICAL HISTORY:  1.  Depression.  2.  Hypertension.   3.  Poorly defined renal disease.     PAST SURGICAL HISTORY:  1.  TAH/BSO in the 1980s.  2.  Staged right and left knee replacements in the 1990s.  3.  L5-S1 fusion in 2011.  4.  Right carpal tunnel release in 2012.    SOCIAL HISTORY:  The patient does not utilize tobacco nor alcohol.    FAMILY HISTORY:  Mother deceased at the age of 76 of carcinoma of unknown type.  Father is   unknown to the patient.  The patient has a brother who is deceased at the age   of 76 of prostate carcinoma and a sister who is unknown to the patient.     REVIEW OF SYSTEMS:  GENERAL:  The patient denies any recent change in her sleep or dietary   patterns, except as noted.  HEENT:  Denies recent changes in her visual or auditory acuity, recurrent   pharyngitis, epistaxis, chronic rhinorrhea.  CARDIOVASCULAR:  Has history of hypertension.  Denies chest pain, heart   murmur.  RESPIRATORY:  Denies increasing shortness of breath, hemoptysis, productive   cough.  GASTROINTESTINAL:  As noted in history of chief complaint.  GENITOURINARY:  Denies frequency, urgency, hesitancy, dysuria.  Has a poorly.    He has a poorly characterized history of renal disease.  MUSCULOSKELETAL:  Denies fractures or dislocations.  NEUROLOGIC:  Denies paralysis, paresthesias, history of stroke.    PHYSICAL EXAMINATION:  VITAL SIGNS:  The patient is 5 foot 0 inches in height, weighing 76 kilograms.    Temperature is 98, pulse 57, respiratory rate 18, blood pressure is 128/60,   room air oxygen saturations are 99%.  CONSTITUTIONAL:  The patient is an alert, [place] female in no acute   distress.  She is oriented times 3 and nontoxic in appearance.  HEENT:  Examination is unremarkable.  LUNGS:  Clear to auscultation without adventitious sounds.  SPINAL:  Notes normal kyphoscoliotic curves without CVA tenderness.  ABDOMEN:  Obese with normoactive bowel sounds and no masses with minimal very   deep tenderness without peritoneal signs, diffusely.  EXTREMITIES:  Full range of motion without deformity.  Pulses are 4/4 and   symmetric in dorsalis pedis and posterior tibial, femoral, radial, brachial   and carotid distributions.  NEUROLOGICAL:  Cranial nerves II-XII are intact.  Motor is 5/5 in all major   muscle groups of the upper and lower extremities in symmetric fashion.    Sensory examination is intact to light touch, pain and proprioception.  Deep   tendon reflexes are +2/4 and symmetric in the upper and lower extremities.    LABORATORY EVALUATION:   At the time of admission on 01/10/2015, consisted of a BMP, CBC, lipase and   LFTs with no results of a chloride of 96, glucose 107, BUN 53, creatinine of   2.6.  Repeat BMP and CBC on 01/11/2015, notes a white blood cell count of 12.2   and a decrease in the hemoglobin of 10.7, a decrease in the hematocrit of   32.4, BUN of 46, creatinine of 2.3, potassium of 3.1.  Abdominal and pelvic CT   scan obtained at the time of her admission, noted a very small hiatal hernia,   small bowel loops measuring up to 3 cm in caliber with a notation of a   transition point in the right lower quadrant, for which the interpretation was   potential of bowel obstruction.    IMPRESSION:  1.  Acute viral gastroenteritis with diarrhea is a prominent presentation, as   well as nausea, without clinical evidence of a bowel obstruction in spite of   the CT interpretation.  2.  Renal insufficiency, with a prerenal component.  3.  Anemia of unknown etiology.  4.  Hypokalemia.  5.  Depression.  6.  Hypertension.    RECOMMENDATIONS:  The patient has been admitted to the medical service for evaluation and   treatment and is currently undergoing IV hydration, should receive potassium   and repletion and pursuit of an anemia workup.  There is no clinical evidence   of bowel obstruction at this time and I would advance her diet as she   tolerates.    If further surgical opinion is needed, please notify me.      ___________________  Warren Danes DO  Dictated By:.   DW  D:01/11/2015 07:33:20  T: 01/11/2015 10:03:16  [id number]

## 2015-01-11 NOTE — Progress Notes (Signed)
Current discharge plan is home with home health ([facility]).      Care Management Interventions  PCP Verified by CM: Yes (Dr. Alba CoryLaurie Goldsticker)  Mode of Transport at Discharge: Self  Discharge Durable Medical Equipment: Yes (cane in the home)  Current Support Network: Lives Alone  Confirm Follow Up Transport: Self  Confirm Transport and Arrange: No  Plan discussed with Pt/Family/Caregiver: Yes  Freedom of Choice Offered: Yes  Discharge Location  Discharge Placement: Home with home health

## 2015-01-11 NOTE — Progress Notes (Signed)
OCCUPATIONAL THERAPY EVALUATION     Patient: [name] ([age] y.o. female)  Room: 2203/2203    Date: 01/11/2015  Start Time:  1000        End Time:  1010    Primary Diagnosis: Hematemesis with epigastric pain          Precautions: none .  Weight bearing precautions: None    Orders and chart reviewed. Discussed with nursing.    ASSESSMENT :  Based on the objective data described below, the patient is independent in basic self care skills and mobility.    Patient does not require further skilled occupational therapy intervention.      PLAN :  Frequency/Duration:  discharge OT services   Discharge Recommendations: Return to familiar environment and resume prior level of care.  Further Equipment Recommendations for Discharge: N/A.     Please refer to Patient Education and Care Plan sections of chart for additional details.    EDUCATION:     Barriers to Learning/Limitations:  None  Education provided patient on  Role of OT, Benefit of activity while hospitalized, Call for assistance, Out of bed 2-3 times/day, Staff assistance with mobility, Changes positions frequently and Sit out of bed for 45-60 minutes or as tolerated    SUBJECTIVE:     Patient agrees to OT.    OBJECTIVE DATA SUMMARY:     Present illness history:    Past Medical history:   Past Medical History   Diagnosis Date   ??? Hypertension        Patient found: Bed, Telemetry and IV.    Pain Assessment: None observed and None reported  Pain Location:  NA    PRIOR LEVEL OF FUNCTION / LIVING SITUATION     Information was obtained by:   patient  Home environment:   Lives alone  Prior level of function:   independent with ADLs and Independent with ambulation  Prior level of Instrumental Activities of Daily Living:   Independent with, heavy housework and preparing full meals    COGNITIVE STATUS:     Mental Status:   Oriented to, person, place, month, year, situation, alert and awake  Communication:   grossly intact  Attention Span:   good(>7130min)   Follows Commands:   intact and 2 step  Hearing:   grossly intact  Vision:   glasses    ACTIVITIES OF DAILY LIVING STATUS:     Based on direct observation and clinical assessment.    Patient is independent in all areas.    FUNCTIONAL MOBILITY and BALANCE STATUS:     Mobility:  Patient is independent in all areas.    Transfers:  Patient is independent in all areas.    Balance:  patient demonstrated good sitting and standing balance.  Patient picked up 2 items from the floor with no LOB.Marland Kitchen.    Activity Tolerance: good.    UPPER EXTREMITY STATUS:     Patient demonstrates grossly graded-  WNL bilateral activerange of motion and strength.    Final position: bedside chair, all needs close and agrees to call for assistance.    Thank you for this referral.  Edward QualiaKimberly A Kaczkowsky, OTR/L  Pager: [phone]

## 2015-01-11 NOTE — Progress Notes (Signed)
Progress Note      Date of Service:  01/11/15    Patient: [name]               Sex: female          DOA: 01/10/2015       Date of Birth:  1941-04-29      Age:  74 y.o.        LOS:        Assessment and Plan:     Possible SBO on CT Scan - ? Partial SBO vs Ileus r/o Complete SBO  Acute Gastroenteritis - ? Hx of hematemesis r/o GI Bleeding  Abdominal Pain  Diarrhea  AKI  HTN       Plan:  Chart reviewed.  Pt seen and examined.  Place pt NPO for now.  NG tube if needed.    Continue IVF.  IV protonix ordered.  General Surgery consult with Dr Frederico Hamman.  CT results   Reviewed in detail and plan of care discussed with Dr Frederico Hamman.  Continue present care o/w.    Further recommendations based on response to treatment and clinical course.             Subjective:     [name] is a 75 y.o. year old female.  Received call from ED - Dr Jerline Pain with report   Of Abd CT from [place].Marland Kitchen  Concern for possible SBO discussed in detail with Dr. Jerline Pain.  Per Dr. Jerline Pain - radiologist stated that pt could have a partial SBO and/or ileus vs SBO .   Per nursing pt is stable thus far.  Pt relates increase in lower abd distention after eating   A small amount following completion of CT scan.  Pt Also relates that she continues to have   intermittent loose watery stools.  No blood in stool per pt.      Objective:      Vital Signs:  Visit Vitals   Item Reading   ??? BP 140/64 mmHg   ??? Pulse 62   ??? Temp 99.1 ??F (37.3 ??C)   ??? Resp 18   ??? Ht 5' (1.524 m)   ??? Wt 71.668 kg (158 lb)   ??? BMI 30.86 kg/m2   ??? SpO2 99%       Physical Exam:     HEENT: NC/AT, PERRLA, EOMI, dry mm  Neck:  Supple, NT  Cardiac:   RRR  Lungs:  Clear  Abdomen:  Soft, hypoactive bowel sounds, diffusely tender  To palpation, no guarding or rebound, mild lower abd distention  Skin:  clear  Neuro:  Intact, no deficits      Intake and Output:  Last three shifts:       Lab Results:  Recent Results (from the past 24 hour(s))   CBC WITH AUTOMATED DIFF    Collection Time: 01/10/15  1:40 PM    Result Value Ref Range    WBC 15.4 (H) 4.0 - 11.0 1000/mm3    RBC 4.07 3.60 - 5.20 M/uL    HGB 13.2 13.0 - 17.2 gm/dl    HCT 40.3 37.0 - 50.0 %    MCV 99.0 (H) 80.0 - 98.0 fL    MCH 32.4 25.4 - 34.6 pg    MCHC 32.8 30.0 - 36.0 gm/dl    PLATELET 197 140 - 450 1000/mm3    MPV 12.6 (H) 6.0 - 10.0 fL    RDW-SD 43.7 36.4 - 46.3  NRBC 0 0 - 0      IMMATURE GRANULOCYTES 0.4 0.0 - 3.0 %    NEUTROPHILS 61.6 34 - 64 %    LYMPHOCYTES 27.7 (L) 28 - 48 %    MONOCYTES 9.8 1 - 13 %    EOSINOPHILS 0.2 0 - 5 %    BASOPHILS 0.3 0 - 3 %   HEPATIC FUNCTION PANEL    Collection Time: 01/10/15  1:40 PM   Result Value Ref Range    AST 28 15 - 37 U/L    ALT 28 12 - 78 U/L    Alk. phosphatase 78 45 - 117 U/L    Bilirubin, total 0.9 0.2 - 1.0 mg/dl    Protein, total 8.3 (H) 6.4 - 8.2 gm/dl    Albumin 4.1 3.4 - 5.0 gm/dl    Bilirubin, direct 0.3 (H) 0.0 - 0.2 mg/dl   LIPASE    Collection Time: 01/10/15  1:40 PM   Result Value Ref Range    Lipase 96 73 - 393 U/L   POC CHEM8    Collection Time: 01/10/15  1:40 PM   Result Value Ref Range    Sodium 136 136 - 145 mEq/L    Potassium 3.5 3.5 - 4.9 mEq/L    Chloride 96 (L) 98 - 107 mEq/L    CO2, TOTAL 24 21 - 32 mmol/L    Glucose 107 (H) 74 - 106 mg/dL    BUN 53 (H) 7 - 25 mg/dl    Creatinine 2.6 (H) 0.6 - 1.3 mg/dl    HCT 45 38 - 45 %    HGB 15.3 12.4 - 17.2 gm/dl    CALCIUM,IONIZED 4.70 4.40 - 5.40 mg/dL   POC FECAL OCCULT BLOOD    Collection Time: 01/10/15  1:52 PM   Result Value Ref Range    Occult blood, stool negative (A) NEGATIVE,Negative         CT Scan Abd/Pelvis without IV Contrast:  Per [facility]:    Impression:  Distal small bowel obstruction with the transition point in the right lower quadrant.  (Obstructed  Loops measure up to 3 cm in caliber.  Normal appendix.  Compressed colon.  No bowel wall thickening,   Pneumatosis, or free intraperitoneal air.)  Free fluid without pneumatosis or free air.  Surgical evaluation recommended.          Medications:   Current Facility-Administered Medications   Medication Dose Route Frequency   ??? pantoprazole (PROTONIX) 80 mg in 0.9% sodium chloride 100 mL infusion  8 mg/hr IntraVENous CONTINUOUS   ??? ondansetron (ZOFRAN) injection 4 mg  4 mg IntraVENous Q4H PRN   ??? 0.9% sodium chloride infusion  100 mL/hr IntraVENous CONTINUOUS   ??? atenolol (TENORMIN) tablet 100 mg  100 mg Oral DAILY   ??? sodium chloride (NS) flush 5-10 mL  5-10 mL IntraVENous Q8H   ??? sodium chloride (NS) flush 5-10 mL  5-10 mL IntraVENous PRN   ??? naloxone (NARCAN) injection 0.1 mg  0.1 mg IntraVENous PRN   ??? acetaminophen (TYLENOL) tablet 650 mg  650 mg Oral Q6H PRN   ??? morphine injection 2 mg  2 mg IntraVENous Q4H PRN   ??? alum-mag hydroxide-simeth (MYLANTA) oral suspension 30 mL  30 mL Oral Q4H PRN   ??? albuterol CONCENTRATE 2.50m/0.5 mL neb soln  2.5 mg Nebulization Q4H PRN   ??? heparin (porcine) injection 5,000 Units  5,000 Units SubCUTAneous Q12H   ??? cefTRIAXone (ROCEPHIN) 1 g in 0.9% sodium  chloride (MBP/ADV) 50 mL MBP  1 g IntraVENous Q24H   ??? [START ON 01/13/2015] pantoprazole (PROTONIX) 40 mg in sodium chloride 0.9 % 10 mL injection  40 mg IntraVENous Q24H       Time Spent:  45 minutes    Knox Saliva, MD  January 11, 2015  1:19 AM

## 2015-01-11 NOTE — Other (Signed)
Bedside shift change report given to tami (oncoming nurse) by jennifer (offgoing nurse). Report included the following information SBAR, Kardex and Cardiac Rhythm sr.

## 2015-01-11 NOTE — Progress Notes (Addendum)
Hospitalist Progress Note               NAME:  [name]   DOB:   [birth year]   MRN:   [id number]     PCP:  Manley Mason, MD  Date/Time:  01/11/2015 9:10 AM      Assessment/Plan:   1. Nausea and vomiting is better no dard stools so will cont pprotonix today if vomits again to get gi to see (has seen dr rickets in past)  i am unsure if sbo clinical picture is not the same will check xray snd see try clears    2. Prerenal has dx of ckd but last cr is 1.3 from 3 years ago  Agree degree of prerenal will cont ivf    3> htn unsure if anxiety to add back norvasc hoold micardis now\\    4. Anxiety over dog appreciate what nursing is doing    carple tunnel oa and depression also listed               Subjective:   Feels ok abdominal pain is gone still  Loose stools    ROS:   Review of Systems   Constitutional: Negative for fever and chills.        Did have flu 2 weeks ago when got back from [place]     Genitourinary: Negative for dysuria.         Objective:   Physical Exam:     BP 128/60 mmHg   Pulse 57   Temp(Src) 98 ??F (36.7 ??C)   Resp 18   Ht 5' (1.524 m)   Wt 76.3 kg (168 lb 3.4 oz)   BMI 32.85 kg/m2   SpO2 99%        Temp (24hrs), Avg:98.2 ??F (36.8 ??C), Min:97.3 ??F (36.3 ??C), Max:99.1 ??F (37.3 ??C)        04/04 1901 - 04/06 0700  In: 8546 [P.O.:240; I.V.:1112]  Out: -     Physical Exam   Constitutional: She appears well-developed and well-nourished. No distress.   HENT:   Head: Normocephalic and atraumatic.   Eyes: No scleral icterus.   Neck: Neck supple.   Cardiovascular: Normal rate, regular rhythm and normal heart sounds.    Pulmonary/Chest: Effort normal and breath sounds normal. No respiratory distress.   Abdominal: Soft.   A little distended soft not really tender     Data Review:       24 Hour Results:  Recent Results (from the past 24 hour(s))   CBC WITH AUTOMATED DIFF    Collection Time: 01/10/15  1:40 PM   Result Value Ref Range    WBC 15.4 (H) 4.0 - 11.0 1000/mm3    RBC 4.07 3.60 - 5.20 M/uL     HGB 13.2 13.0 - 17.2 gm/dl    HCT 40.3 37.0 - 50.0 %    MCV 99.0 (H) 80.0 - 98.0 fL    MCH 32.4 25.4 - 34.6 pg    MCHC 32.8 30.0 - 36.0 gm/dl    PLATELET 197 140 - 450 1000/mm3    MPV 12.6 (H) 6.0 - 10.0 fL    RDW-SD 43.7 36.4 - 46.3      NRBC 0 0 - 0      IMMATURE GRANULOCYTES 0.4 0.0 - 3.0 %    NEUTROPHILS 61.6 34 - 64 %    LYMPHOCYTES 27.7 (L) 28 - 48 %    MONOCYTES 9.8 1 - 13 %  EOSINOPHILS 0.2 0 - 5 %    BASOPHILS 0.3 0 - 3 %   HEPATIC FUNCTION PANEL    Collection Time: 01/10/15  1:40 PM   Result Value Ref Range    AST 28 15 - 37 U/L    ALT 28 12 - 78 U/L    Alk. phosphatase 78 45 - 117 U/L    Bilirubin, total 0.9 0.2 - 1.0 mg/dl    Protein, total 8.3 (H) 6.4 - 8.2 gm/dl    Albumin 4.1 3.4 - 5.0 gm/dl    Bilirubin, direct 0.3 (H) 0.0 - 0.2 mg/dl   LIPASE    Collection Time: 01/10/15  1:40 PM   Result Value Ref Range    Lipase 96 73 - 393 U/L   POC CHEM8    Collection Time: 01/10/15  1:40 PM   Result Value Ref Range    Sodium 136 136 - 145 mEq/L    Potassium 3.5 3.5 - 4.9 mEq/L    Chloride 96 (L) 98 - 107 mEq/L    CO2, TOTAL 24 21 - 32 mmol/L    Glucose 107 (H) 74 - 106 mg/dL    BUN 53 (H) 7 - 25 mg/dl    Creatinine 2.6 (H) 0.6 - 1.3 mg/dl    HCT 45 38 - 45 %    HGB 15.3 12.4 - 17.2 gm/dl    CALCIUM,IONIZED 4.70 4.40 - 5.40 mg/dL   POC FECAL OCCULT BLOOD    Collection Time: 01/10/15  1:52 PM   Result Value Ref Range    Occult blood, stool negative (A) NEGATIVE,Negative     CBC WITH AUTOMATED DIFF    Collection Time: 01/11/15  2:45 AM   Result Value Ref Range    WBC 12.2 (H) 4.0 - 11.0 1000/mm3    RBC 3.27 (L) 3.60 - 5.20 M/uL    HGB 10.7 (L) 13.0 - 17.2 gm/dl    HCT 32.4 (L) 37.0 - 50.0 %    MCV 99.1 (H) 80.0 - 98.0 fL    MCH 32.7 25.4 - 34.6 pg    MCHC 33.0 30.0 - 36.0 gm/dl    PLATELET 166 140 - 450 1000/mm3    MPV 12.9 (H) 6.0 - 10.0 fL    RDW-SD 44.1 36.4 - 46.3      NRBC 0 0 - 0      IMMATURE GRANULOCYTES 0.3 0.0 - 3.0 %    NEUTROPHILS 55.9 34 - 64 %    LYMPHOCYTES 31.3 28 - 48 %     MONOCYTES 11.0 1 - 13 %    EOSINOPHILS 1.1 0 - 5 %    BASOPHILS 0.4 0 - 3 %   METABOLIC PANEL, BASIC    Collection Time: 01/11/15  2:45 AM   Result Value Ref Range    Sodium 138 136 - 145 mEq/L    Potassium 3.1 (L) 3.5 - 5.1 mEq/L    Chloride 106 98 - 107 mEq/L    CO2 24 21 - 32 mEq/L    Glucose 119 (H) 74 - 106 mg/dl    BUN 46 (H) 7 - 25 mg/dl    Creatinine 2.3 (H) 0.6 - 1.3 mg/dl    GFR est AA 27.0      GFR est non-AA 22      Calcium 8.3 (L) 8.5 - 10.1 mg/dl       Problem List:       Medications reviewed  Current Facility-Administered Medications   Medication Dose  Route Frequency   ??? pantoprazole (PROTONIX) 80 mg in 0.9% sodium chloride 100 mL infusion  8 mg/hr IntraVENous CONTINUOUS   ??? ondansetron (ZOFRAN) injection 4 mg  4 mg IntraVENous Q4H PRN   ??? atenolol (TENORMIN) tablet 100 mg  100 mg Oral DAILY   ??? sodium chloride (NS) flush 5-10 mL  5-10 mL IntraVENous Q8H   ??? sodium chloride (NS) flush 5-10 mL  5-10 mL IntraVENous PRN   ??? naloxone (NARCAN) injection 0.1 mg  0.1 mg IntraVENous PRN   ??? acetaminophen (TYLENOL) tablet 650 mg  650 mg Oral Q6H PRN   ??? morphine injection 2 mg  2 mg IntraVENous Q4H PRN   ??? alum-mag hydroxide-simeth (MYLANTA) oral suspension 30 mL  30 mL Oral Q4H PRN   ??? albuterol CONCENTRATE 2.77m/0.5 mL neb soln  2.5 mg Nebulization Q4H PRN   ??? heparin (porcine) injection 5,000 Units  5,000 Units SubCUTAneous Q12H   ??? cefTRIAXone (ROCEPHIN) 1 g in 0.9% sodium chloride (MBP/ADV) 50 mL MBP  1 g IntraVENous Q24H   ??? [START ON 01/13/2015] pantoprazole (PROTONIX) 40 mg in sodium chloride 0.9 % 10 mL injection  40 mg IntraVENous Q24H        No Known Allergies    Care Plan discussed with:         JUvaldo Bristle MD  January 11, 2015  9:10 AM

## 2015-01-11 NOTE — Progress Notes (Signed)
Problem: Mobility Impaired (Adult and Pediatric)  Goal: *Acute Goals and Plan of Care (Insert Text)  PHYSICAL THERAPY EVALUATION    Patient: [name] ([age] y.o. female)  Room: 2203/2203    Date: 01/11/2015  Start Time:  1:38  End Time:  1:59    Primary Diagnosis: Hematemesis with epigastric pain          Precautions: Falls.    Orders reviewed, chart reviewed, discussed with patient's RN and initial evaluation completed on Danyla G Hausmann.      ASSESSMENT :  Pt is independent with all activities including ambulation. No need for skilled PT at this time.          PLAN :  No need of skilled PT at this time. D/C PT.           SUBJECTIVE:   Patient: "I am feeling bloated"      OBJECTIVE DATA SUMMARY:   Present illness history:    Past Medical history:   Past Medical History   Diagnosis Date   ??? Hypertension         Prior Level of Function/Home Situation:   Home environment: Lives alone in 1 story house with no stair.  Prior level of function: Retired, drove, independent without limitations, Independent with ambulation and community ambulatory  Prior level of Activities of Daily Living: Independent with, grocery shopping and yard work   Home equipment: None      Patient found: Bedside chair and IV.    Pain Assessment before PT session: 1/10  Pain Location:  abdomen  Pain Assessment after PT session: 1/10  Pain Location:  abdomen  [ ]           Yes, patient had pain medications  [ ]           No, Patient has not had pain medications  [X]           Nurse notified      COGNITIVE STATUS:     Mental Status: Oriented x3 and pleasant.  Communication: normal.  Follows commands: intact.  General Cognition: intact .  Hearing: grossly intact.      EXTREMITIES ASSESSMENT:      Tone & Sensation:   Normal sensation to light touch  0 =No increase in muscle tone    Strength:    LE grossly 5/5 bilaterally     Range Of Motion:  LE AROM WFL bilaterally      Functional mobility and balance status:      Supine to sit -  modified independent and  with logroll  Sit to Supine -  modified independent, supervision and  with logroll  Sit to Stand -  modified independent  Stand to Sit -  modified independent    Reinforcement for correct logroll technique during supine to sit.    Balance:   Static Sitting Balance -  normal  Dynamic Sitting Balance -  normal  Static Standing Balance -  good  Dynamic Standing Balance -  good     Ambulation/Gait Training:  steady, reciprocal and decreased cadence None, IV,  modified independent  350 feet    Pt reported feeling better during ambulation    Stair Training:  not tested due to IV attachement    Activity Tolerance:   good    Final Location:   bedside chair, all needs close, agrees to call for assistance and nurse notified       COMMUNICATION/EDUCATION:   Education: Patient, Benefit of activity while  hospitalized, Call for assistance, Changes positions frequently, Safety, Functional mobility and Verbalized understanding, PT role and POC.    Pt neuro intact with good safety awareness with no LE mobility deficits. Pt was instructed it is OK for her to ambualte in a hall with assistance if RN is agree.    Barriers to Learning/Limitations: None    Please refer to care plan and patient education section for further details.    Thank you for this referral.     Olena Ivanova/PT   Pager: [phone]

## 2015-01-12 MED ORDER — DIPHENOXYLATE-ATROPINE 2.5 MG-0.025 MG TAB
ORAL_TABLET | Freq: Four times a day (QID) | ORAL | Status: AC | PRN
Start: 2015-01-12 — End: ?

## 2015-01-12 MED ORDER — OMEPRAZOLE 40 MG CAP, DELAYED RELEASE
40 mg | ORAL_CAPSULE | Freq: Every day | ORAL | Status: DC
Start: 2015-01-12 — End: 2015-01-19

## 2015-01-12 MED FILL — HEPARIN (PORCINE) 5,000 UNIT/ML IJ SOLN: 5000 unit/mL | INTRAMUSCULAR | Qty: 1

## 2015-01-12 MED FILL — ATENOLOL 50 MG TAB: 50 mg | ORAL | Qty: 2

## 2015-01-12 MED FILL — AMLODIPINE 2.5 MG TAB: 2.5 mg | ORAL | Qty: 1

## 2015-01-12 MED FILL — BD POSIFLUSH NORMAL SALINE 0.9 % INJECTION SYRINGE: INTRAMUSCULAR | Qty: 10

## 2015-01-12 MED FILL — PROTONIX 40 MG INTRAVENOUS SOLUTION: 40 mg | INTRAVENOUS | Qty: 80

## 2015-01-12 NOTE — Progress Notes (Signed)
Patient discharged home with no needs.  Patient to drive herself home - car is in the ER parking lot.

## 2015-01-12 NOTE — Discharge Summary (Signed)
[facility]  Discharge Summary   NAME:  Genao, Vallarie  SEX:   F  ADMIT: 01/10/2015  DISCH:   DOB:   November 04, 1940  MR#    [id number]  ACCT#  [id number]    cc: Anselm JunglingLAURIE BERMAN MD    DATE OF ADMISSION:    01/10/2015    DATE OF DISCHARGE:    01/12/2015    Activity should be as tolerated.  Diet should be slowly picking up her   appetite but pushing fluids and then get back to normal.  She should followup   and see Dr. Selinda OrionBerman on Monday for an office visit.  I am going to try to call   her if I can. At that time she will need a BMP and a CBC.    DISCHARGE MEDICATIONS:  I am going to continue her on her usual.  Her blood pressure has come up while   she is here.  I am going to put her back on her usual, I do not want her to   be hypertensive, but I do worry a little bit.    DISCHARGE MEDICATIONS:  Amlodipine 2.5 a day, Atenolol 100 a day, losartan/hydrochlorothiazide   80/12.4, that is the one I am a little worried about but I am going to give it   to her. I am going to also put her on Prilosec 40 mg a day and give her some   Lomotil to take up to 4 times a day as needed for the diarrhea.    DISCHARGE DIAGNOSES:     1.  Nausea, vomiting, diarrhea, probably from a viral syndrome, improving   though CAT scan showed possibility small bowel obstruction but clinical   picture was not this at all.  2.  Acute kidney injury with underlying probably chronic kidney disease 3.  I   do not have any labs for a couple of years.  I do note that 3 years ago her   creatinine is 1.3 on discharge is 2.2.  I think it is probably about 2ish but   I will leave that to Dr. Ulla PotashGoldsticker to follow up on.  3. Hematemesis x1 with no further and heme negative stools, I am just going to   put her on a PPI, probably for a month if she wants to follow up for   endoscopy or such, I do not think that is unreasonable, but if she is stable   and her hemoglobin at discharge is 12 something, so I think that is   reasonable.   4.  Anxiety depressive disorder.  I do worry about her with this.  She is kind   of alone since her husband does not do much, but I do not know her and I just   think that that is the right thing for followup.  Please see dictated H&P for   details on admission by Dr. Langston MaskerMorris.    HOSPITAL COURSE:  1.  Nausea, vomiting, etc. The patient was admitted to the floor, put on IV   PPI.  She was also seen by Dr. Karleen HampshireSpencer, her surgeon who did not think this was   ESBL at all and again her exam was not like it.  She was given IV fluids.    She had no further nausea, vomiting and actually she really made herself   vomit. Her stools were heme negative.  Her diarrhea is slowing up to the point   where she wants to go  home, and she feels better.  I think it is reasonable   to let her go home with some Lomotil, all her testing has been negative so far   with that.  2.  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  I do note that when she came in, her BUN and creatinine were 53   and 2.6, the day before discharge it was 46 and 2.3.  I bet her normal   creatinine is about 2ish but I will leave that for followup.  I do not know   and I say she is better, so I think we can let her go.  3. For the hematemesis I do note that her hemoglobin was 10.7 and 32.4 on   discharge with normal platelet count.  That can be just followed up as an   outpatient.      I do look back, Dr. Ulla Potash has been ordering stuff for her kidney   disorder, again, I just do not see any labs recently for her.  I am going to   try to call Dr. Ulla Potash just update her if I can, it is too early.    I appreciate her referring her patients to the [facility].    Total time coordinating care today was 34 minutes.      ___________________  Ollen Bowl MD  Dictated By: .   Davonna Belling  D:01/12/2015 07:48:43  T: 01/12/2015 08:27:01  [id number]

## 2015-01-12 NOTE — Other (Signed)
Received end of shift report from [name], Charity fundraiserN.  Pt is lying in bed awake, alert and oriented x 4.  No acute distress noted.

## 2015-01-12 NOTE — Other (Signed)
Protonix IV switched from Right AC to newly placed IV on Left forearm/lateral.  IV continued at 9010ml/hr.

## 2015-01-12 NOTE — Progress Notes (Signed)
Problem: Falls - Risk of  Goal: *Absence of falls  Outcome: Progressing Towards Goal  Pt has remained free from falls.   Bed alarm on bed at lowest

## 2015-01-19 ENCOUNTER — Emergency Department: Admit: 2015-01-20 | Payer: MEDICARE | Primary: Family Medicine

## 2015-01-19 DIAGNOSIS — K5660 Unspecified intestinal obstruction: Principal | ICD-10-CM

## 2015-01-19 NOTE — ED Notes (Signed)
Pt with abd pain,sent in by pmd for possible bowel obstruction via x-ray today

## 2015-01-19 NOTE — ED Provider Notes (Signed)
[facility]  EMERGENCY DEPARTMENT TREATMENT REPORT  NAME:  [name], [name]  SEX:   F  ADMIT: 01/19/2015  DOB:   05/04/1941  MR#    [id number]  ROOM:  5123  TIME DICTATED: 11 37 PM  ACCT#  [id number]        I hereby certify this patient for admission based upon medical necessity as   noted below:     CHIEF COMPLAINT:  Abdominal pain.    HISTORY OF PRESENT ILLNESS:  This is a 74 year old Hispanic female that comes into the Emergency Department   complaining of abdominal discomfort that has been going on for a couple of   weeks.  Was seen here recently and admitted to the hospital.  She states that   she had a blockage when they admitted her and they evaluated her and sent her   home and she has still been complaining of abdominal pain and nausea.    Appetite is remote.  States very minimal bowel movements.  Has not really   noticed any fevers or chills.  No back pain.    REVIEW OF SYSTEMS:    CONSTITUTIONAL:  No fever, chills, or weight loss.   EYES:   No visual symptoms.   ENT:  No sore throat, runny nose, or other URI symptoms.   ENDOCRINE:  No diabetic symptoms.   HEMATOLOGIC/LYMPHATIC:   No excessive bruising or lymph node swelling.   ALLERGIC/IMMUNOLOGIC:  No urticaria or allergy symptoms.   RESPIRATORY:  No cough, shortness of breath, or wheezing.   GASTROINTESTINAL:  Abdominal pain, nausea.  GENITOURINARY:  No dysuria, frequency, or urgency.   MUSCULOSKELETAL:  No joint pain or swelling.   INTEGUMENTARY:  No rashes.   NEUROLOGICAL:  No headaches, sensory or motor symptoms.     PAST MEDICAL HISTORY:  Hypertension, hysterectomy.    FAMILY HISTORY:  Diabetes.    SOCIAL HISTORY:  Does not drink or smoke.    ALLERGIES:  SEE EPIC.    MEDICATIONS:  Multiple and reviewed in EPIC.     PHYSICAL EXAMINATION:  VITAL SIGNS:  133/78, 72, 98.5, pain scale 6 out of 10, sats of 100%.  GENERAL APPEARANCE:  Patient appears well developed and well nourished.    Appearance HEENT:  Normocephalic, atraumatic.    EYES:  Conjunctivae clear, lids normal.  Pupils equal, symmetrical, and   normally reactive.  NECK:  Supple, nontender, symmetrical, no masses or JVD, trachea midline,   thyroid not enlarged, nodular, or tender.   NECK:  Supple, nontender, symmetrical, no masses or JVD, trachea midline,   thyroid not enlarged, nodular, or tender.   RESPIRATORY:  Clear and equal breath sounds.  No respiratory distress,   tachypnea, or accessory muscle use.   CARDIOVASCULAR:  Heart regular rate and rhythm without any rubs, murmurs,   gallops or thrills.    ASSESSMENT:   This is a 74 year old who comes in complaining of abdominal discomfort,   diffuse.  We will rule out small-bowel obstruction and ischemic bowel colitis.     Since she already had an x-ray and a CT scan done last week we will go   ahead and do an x-ray, a flat and upright, give her 4 of morphine, 4 of   Zofran, CBC, CMP and lipase.  Reevaluate afterwards.    This is an acute exacerbation of a chronic condition for this patient.  Old   records were reviewed.  No additional relevant information was obtained.  The  patient's history was discussed with family members.  No additional relevant   information was obtained.  Nursing notes were reviewed.     CONTINUATION BY Dianna RossettiHELEN Anjeli Casad, MD:    The patient was seen and examined with Hilaria OtaNelson Santiago, PA-C.  We have gone   over the history and physical together.  I agree with his management and plan.        The patient clinically looks like she has symptoms of small bowel obstruction.    She is not having any bowel movements or passing gas for several days.  She   has increasing abdominal girth, feels really nauseated, only tolerating   liquids.  Acute abdominal series confirms what looks like a small-bowel   obstruction.      Her labs, urinalysis is negative.  Sodium 135, potassium 3, BUN 30, creatinine   2.2.   LFTs normal.      I spoke with Dr. Fenton Mallinguiz for recheck.  I spoke to Dr. Wenda LowMatriano to arrange for   admission.    IMPRESSION:   1.  Acute small-bowel obstruction.  2.  Acute-on-chronic renal insufficiency.  3.  Hypokalemia.  4.  Dehydration.    DISPOSITION:  Admit to general medical under Dr. Wenda LowMatriano with consultation with Dr. Fenton Mallinguiz.    Dr. Fenton Mallinguiz has seen her in the Emergency Department tonight.      ADDENDUM BY NELSON SANTIAGO, PA-C 01/26/2015    DIAGNOSTIC INTERPRETATIONS:  Patient's WBC 15.2, H&H is 13 and 41, platelets are 210.  BMP of 135,   potassium of 3.0, creatinine of 2.2, BUN 30.  Lactic acid of 1.6, lipase 292,   alkaline phosphatase of 85, ALT of 40, AST of 28.  Urinalysis came back   negative.  X-ray reveals multiple air fluid levels, dilated small loops.      EMERGENCY DEPARTMENT COURSE:    At this present time, the patient had an NG tube placed and admitted to a   regular floor for small-bowel obstruction.      ___________________  Dianna RossettiHelen Kaytie Ratcliffe M.D.  Dictated By: Hilaria OtaNelson Santiago, PA-C    My signature above authenticates this document and my orders, the final   diagnosis (es), discharge prescription (s), and instructions in the Epic   record.  If you have any questions please contact [phone].    Nursing notes have been reviewed by the physician/ advanced practice   clinician.    CDC  D:01/19/2015 23:37:32  T: 01/19/2015 23:53:33  [id number]

## 2015-01-20 ENCOUNTER — Inpatient Hospital Stay
Admit: 2015-01-20 | Discharge: 2015-01-25 | Disposition: A | Discharge status: Home / Self Care | Payer: MEDICARE | Attending: Internal Medicine | Admitting: Internal Medicine

## 2015-01-20 LAB — URINALYSIS W/ RFLX MICROSCOPIC
Bilirubin: NEGATIVE
Blood: NEGATIVE
Glucose: NEGATIVE mg/dl
Ketone: 15 mg/dl — AB
Leukocyte Esterase: NEGATIVE
Nitrites: NEGATIVE
Specific gravity: 1.025 (ref 1.005–1.030)
Urobilinogen: 0.2 mg/dl (ref 0.0–1.0)
pH (UA): 6 (ref 5.0–9.0)

## 2015-01-20 LAB — METABOLIC PANEL, BASIC
BUN: 34 mg/dl — ABNORMAL HIGH (ref 7–25)
CO2: 27 mEq/L (ref 21–32)
Calcium: 9.7 mg/dl (ref 8.5–10.1)
Chloride: 101 mEq/L (ref 98–107)
Creatinine: 1.9 mg/dl — ABNORMAL HIGH (ref 0.6–1.3)
GFR est AA: 33
GFR est non-AA: 27
Glucose: 132 mg/dl — ABNORMAL HIGH (ref 74–106)
Potassium: 3.1 mEq/L — ABNORMAL LOW (ref 3.5–5.1)
Sodium: 137 mEq/L (ref 136–145)

## 2015-01-20 LAB — LACTIC ACID
Lactic Acid: 1.6 mmol/L (ref 0.4–2.0)
Lactic Acid: 1 mmol/L (ref 0.4–2.0)

## 2015-01-20 LAB — METABOLIC PANEL, COMPREHENSIVE
ALT (SGPT): 40 U/L (ref 12–78)
AST (SGOT): 28 U/L (ref 15–37)
Albumin: 4.3 gm/dl (ref 3.4–5.0)
Alk. phosphatase: 85 U/L (ref 45–117)
BUN: 30 mg/dl — ABNORMAL HIGH (ref 7–25)
Bilirubin, total: 0.7 mg/dl (ref 0.2–1.0)
CO2: 25 mEq/L (ref 21–32)
Calcium: 10.5 mg/dl — ABNORMAL HIGH (ref 8.5–10.1)
Chloride: 98 mEq/L (ref 98–107)
Creatinine: 2.2 mg/dl — ABNORMAL HIGH (ref 0.6–1.3)
GFR est AA: 28
GFR est non-AA: 23
Glucose: 103 mg/dl (ref 74–106)
Potassium: 3 mEq/L — ABNORMAL LOW (ref 3.5–5.1)
Protein, total: 8.9 gm/dl — ABNORMAL HIGH (ref 6.4–8.2)
Sodium: 135 mEq/L — ABNORMAL LOW (ref 136–145)

## 2015-01-20 LAB — POC URINE MACROSCOPIC
Blood: NEGATIVE
Glucose: NEGATIVE mg/dl
Ketone: 15 mg/dl — AB
Leukocyte Esterase: NEGATIVE
Nitrites: NEGATIVE
Protein: 30 mg/dl — AB
Specific gravity: 1.02 (ref 1.005–1.030)
Urobilinogen: 0.2 EU/dl (ref 0.0–1.0)
pH (UA): 5.5 (ref 5–9)

## 2015-01-20 LAB — CBC WITH AUTOMATED DIFF
BASOPHILS: 0.3 % (ref 0–3)
EOSINOPHILS: 0.3 % (ref 0–5)
HCT: 41.4 % (ref 37.0–50.0)
HGB: 13.1 gm/dl (ref 13.0–17.2)
IMMATURE GRANULOCYTES: 0.4 % (ref 0.0–3.0)
LYMPHOCYTES: 23.1 % — ABNORMAL LOW (ref 28–48)
MCH: 31.6 pg (ref 25.4–34.6)
MCHC: 31.6 gm/dl (ref 30.0–36.0)
MCV: 99.8 fL — ABNORMAL HIGH (ref 80.0–98.0)
MONOCYTES: 11.2 % (ref 1–13)
MPV: 13.7 fL — ABNORMAL HIGH (ref 6.0–10.0)
NEUTROPHILS: 64.7 % — ABNORMAL HIGH (ref 34–64)
NRBC: 0 (ref 0–0)
PLATELET: 210 10*3/uL (ref 140–450)
RBC: 4.15 M/uL (ref 3.60–5.20)
RDW-SD: 44.3 (ref 36.4–46.3)
WBC: 15.2 10*3/uL — ABNORMAL HIGH (ref 4.0–11.0)

## 2015-01-20 LAB — GLUCOSE, POC: Glucose (POC): 103 mg/dL (ref 65–105)

## 2015-01-20 LAB — POC URINE MICROSCOPIC

## 2015-01-20 LAB — LIPASE: Lipase: 292 U/L (ref 73–393)

## 2015-01-20 LAB — MAGNESIUM: Magnesium: 1.8 mg/dl (ref 1.8–2.4)

## 2015-01-20 MED ORDER — ONDANSETRON (PF) 4 MG/2 ML INJECTION
4 mg/2 mL | INTRAMUSCULAR | Status: DC | PRN
Start: 2015-01-20 — End: 2015-01-20

## 2015-01-20 MED ORDER — SODIUM CHLORIDE 0.9 % IV
INTRAVENOUS | Status: DC
Start: 2015-01-20 — End: 2015-01-20
  Administered 2015-01-20: 07:00:00 via INTRAVENOUS

## 2015-01-20 MED ORDER — SODIUM CHLORIDE 0.9 % IV
55 mg/ mL | Freq: Four times a day (QID) | INTRAVENOUS | Status: DC | PRN
Start: 2015-01-20 — End: 2015-01-25

## 2015-01-20 MED ORDER — POTASSIUM CHLORIDE 10 MEQ/100 ML IV PIGGY BACK
10 mEq/0 mL | INTRAVENOUS | Status: AC
Start: 2015-01-20 — End: 2015-01-20
  Administered 2015-01-20: 07:00:00 via INTRAVENOUS

## 2015-01-20 MED ORDER — ONDANSETRON (PF) 4 MG/2 ML INJECTION
4 mg/2 mL | Freq: Once | INTRAMUSCULAR | Status: AC
Start: 2015-01-20 — End: 2015-01-20
  Administered 2015-01-20: 02:00:00 via INTRAVENOUS

## 2015-01-20 MED ORDER — POTASSIUM CHLORIDE SR 20 MEQ TAB, PARTICLES/CRYSTALS
20 mEq | Freq: Once | ORAL | Status: AC
Start: 2015-01-20 — End: 2015-01-20
  Administered 2015-01-20: 21:00:00 via ORAL

## 2015-01-20 MED ORDER — HEPARIN (PORCINE) 5,000 UNIT/ML IJ SOLN
5000 unit/mL | Freq: Two times a day (BID) | INTRAMUSCULAR | Status: DC
Start: 2015-01-20 — End: 2015-01-25
  Administered 2015-01-20 – 2015-01-25 (×12): via SUBCUTANEOUS

## 2015-01-20 MED ORDER — ONDANSETRON (PF) 4 MG/2 ML INJECTION
4 mg/2 mL | Freq: Once | INTRAMUSCULAR | Status: AC
Start: 2015-01-20 — End: 2015-01-20
  Administered 2015-01-20: 05:00:00 via INTRAVENOUS

## 2015-01-20 MED ORDER — PHENOL 1.4 % MUCOSAL AEROSOL SPRAY
1.4 % | Status: DC | PRN
Start: 2015-01-20 — End: 2015-01-25
  Administered 2015-01-20: 19:00:00 via ORAL

## 2015-01-20 MED ORDER — MORPHINE 4 MG/ML SYRINGE
4 mg/mL | INTRAMUSCULAR | Status: AC | PRN
Start: 2015-01-20 — End: 2015-01-21
  Administered 2015-01-21 (×2): via INTRAVENOUS

## 2015-01-20 MED ORDER — D5-LR WITH POTASSIUM CHLORIDE 20 MEQ/L IV
20 mEq/L | INTRAVENOUS | Status: AC
Start: 2015-01-20 — End: 2015-01-21
  Administered 2015-01-20 – 2015-01-21 (×3): via INTRAVENOUS

## 2015-01-20 MED ORDER — SODIUM CHLORIDE 0.9 % INJECTION
40 mg | Freq: Every day | INTRAMUSCULAR | Status: DC
Start: 2015-01-20 — End: 2015-01-25
  Administered 2015-01-20 – 2015-01-25 (×6): via INTRAVENOUS

## 2015-01-20 MED ORDER — ONDANSETRON (PF) 4 MG/2 ML INJECTION
4 mg/2 mL | Freq: Four times a day (QID) | INTRAMUSCULAR | Status: DC | PRN
Start: 2015-01-20 — End: 2015-01-25
  Administered 2015-01-21 – 2015-01-24 (×4): via INTRAVENOUS

## 2015-01-20 MED ORDER — PROMETHAZINE IN NS 12.5 MG/50 ML IV PIGGY BAG
12.5 mg/50 ml | Freq: Four times a day (QID) | INTRAVENOUS | Status: DC | PRN
Start: 2015-01-20 — End: 2015-01-25

## 2015-01-20 MED ORDER — HYDROMORPHONE (PF) 1 MG/ML IJ SOLN
1 mg/mL | INTRAMUSCULAR | Status: AC
Start: 2015-01-20 — End: 2015-01-20
  Administered 2015-01-20: 05:00:00 via INTRAVENOUS

## 2015-01-20 MED ORDER — ACETAMINOPHEN 650 MG RECTAL SUPPOSITORY
650 mg | RECTAL | Status: DC | PRN
Start: 2015-01-20 — End: 2015-01-25

## 2015-01-20 MED ORDER — SODIUM CHLORIDE 0.9 % IJ SYRG
Freq: Once | INTRAMUSCULAR | Status: AC
Start: 2015-01-20 — End: 2015-01-19
  Administered 2015-01-20: 03:00:00 via INTRAVENOUS

## 2015-01-20 MED ORDER — ACETAMINOPHEN 1,000 MG/100 ML (10 MG/ML) IV
1000 mg/100 mL (10 mg/mL) | Freq: Three times a day (TID) | INTRAVENOUS | Status: AC | PRN
Start: 2015-01-20 — End: 2015-01-21

## 2015-01-20 MED ORDER — MINERAL OIL ORAL
Freq: Four times a day (QID) | ORAL | Status: AC
Start: 2015-01-20 — End: 2015-01-20
  Administered 2015-01-20 (×2): via NASOGASTRIC

## 2015-01-20 MED FILL — D5-LR WITH POTASSIUM CHLORIDE 20 MEQ/L IV: 20 mEq/L | INTRAVENOUS | Qty: 1000

## 2015-01-20 MED FILL — HYDROMORPHONE (PF) 1 MG/ML IJ SOLN: 1 mg/mL | INTRAMUSCULAR | Qty: 1

## 2015-01-20 MED FILL — KLOR-CON M20 MEQ TABLET,EXTENDED RELEASE: 20 mEq | ORAL | Qty: 2

## 2015-01-20 MED FILL — PROMETHAZINE IN NS 12.5 MG/50 ML IV PIGGY BAG: 12.5 mg/50 ml | INTRAVENOUS | Qty: 50

## 2015-01-20 MED FILL — CHLORASEPTIC THROAT SPRAY 1.4 % AEROSOL: 1.4 % | Qty: 20

## 2015-01-20 MED FILL — HEPARIN (PORCINE) 5,000 UNIT/ML IJ SOLN: 5000 unit/mL | INTRAMUSCULAR | Qty: 1

## 2015-01-20 MED FILL — ONDANSETRON (PF) 4 MG/2 ML INJECTION: 4 mg/2 mL | INTRAMUSCULAR | Qty: 2

## 2015-01-20 MED FILL — POTASSIUM CHLORIDE 10 MEQ/100 ML IV PIGGY BACK: 10 mEq/0 mL | INTRAVENOUS | Qty: 100

## 2015-01-20 MED FILL — SODIUM CHLORIDE 0.9 % INJECTION: INTRAMUSCULAR | Qty: 10

## 2015-01-20 MED FILL — METOPROLOL TARTRATE 5 MG/5 ML IV SOLN: 5 mg/ mL | INTRAVENOUS | Qty: 2.5

## 2015-01-20 MED FILL — MINERAL OIL ORAL: ORAL | Qty: 30

## 2015-01-20 MED FILL — SODIUM CHLORIDE 0.9 % IV: INTRAVENOUS | Qty: 1000

## 2015-01-20 NOTE — Other (Signed)
.  Bedside shift change report given to Athelia RN (oncoming nurse) by Joan RN (offgoing nurse). Report included the following information SBAR.

## 2015-01-20 NOTE — Progress Notes (Signed)
Patient: [name]               Sex: female          DOA: 01/19/2015       Date of Birth:  1941-08-08      Age:  74 y.o.        LOS:  LOS: 0 days     POD * No surgery found *    Procedure:  * No surgery found *    Subjective:     Patient has no complaints. Feels better, states she feels less bloated     Objective:     Blood pressure 109/72, pulse 71, temperature 98 ??F (36.7 ??C), resp. rate 16, height 5' (1.524 m), weight 68.04 kg (150 lb), SpO2 97 %, not currently breastfeeding.    Temp (24hrs), Avg:98.2 ??F (36.8 ??C), Min:98 ??F (36.7 ??C), Max:98.5 ??F (36.9 ??C)      Physical Exam:  ABDOMEN: hypoactive BS, distended, nontender    Labs:    Recent Results (from the past 24 hour(s))   METABOLIC PANEL, COMPREHENSIVE    Collection Time: 01/19/15  9:43 PM   Result Value Ref Range    Sodium 135 (L) 136 - 145 mEq/L    Potassium 3.0 (L) 3.5 - 5.1 mEq/L    Chloride 98 98 - 107 mEq/L    CO2 25 21 - 32 mEq/L    Glucose 103 74 - 106 mg/dl    BUN 30 (H) 7 - 25 mg/dl    Creatinine 2.2 (H) 0.6 - 1.3 mg/dl    GFR est AA 28.0      GFR est non-AA 23      Calcium 10.5 (H) 8.5 - 10.1 mg/dl    AST 28 15 - 37 U/L    ALT 40 12 - 78 U/L    Alk. phosphatase 85 45 - 117 U/L    Bilirubin, total 0.7 0.2 - 1.0 mg/dl    Protein, total 8.9 (H) 6.4 - 8.2 gm/dl    Albumin 4.3 3.4 - 5.0 gm/dl   CBC WITH AUTOMATED DIFF    Collection Time: 01/19/15  9:43 PM   Result Value Ref Range    WBC 15.2 (H) 4.0 - 11.0 1000/mm3    RBC 4.15 3.60 - 5.20 M/uL    HGB 13.1 13.0 - 17.2 gm/dl    HCT 41.4 37.0 - 50.0 %    MCV 99.8 (H) 80.0 - 98.0 fL    MCH 31.6 25.4 - 34.6 pg    MCHC 31.6 30.0 - 36.0 gm/dl    PLATELET 210 140 - 450 1000/mm3    MPV 13.7 (H) 6.0 - 10.0 fL    RDW-SD 44.3 36.4 - 46.3      NRBC 0 0 - 0      IMMATURE GRANULOCYTES 0.4 0.0 - 3.0 %    NEUTROPHILS 64.7 (H) 34 - 64 %    LYMPHOCYTES 23.1 (L) 28 - 48 %    MONOCYTES 11.2 1 - 13 %    EOSINOPHILS 0.3 0 - 5 %    BASOPHILS 0.3 0 - 3 %   LIPASE    Collection Time: 01/19/15  9:43 PM    Result Value Ref Range    Lipase 292 73 - 393 U/L   LACTIC ACID, PLASMA    Collection Time: 01/19/15  9:43 PM   Result Value Ref Range    Lactic Acid 1.6 0.4 - 2.0 mmol/L   POC URINE  MACROSCOPIC    Collection Time: 01/19/15 10:19 PM   Result Value Ref Range    Glucose Negative NEGATIVE,Negative mg/dl    Bilirubin Small (A) NEGATIVE,Negative      Ketone 15 (A) NEGATIVE,Negative mg/dl    Specific gravity 1.020 1.005 - 1.030      Blood Negative NEGATIVE,Negative      pH (UA) 5.5 5 - 9      Protein 30 (A) NEGATIVE,Negative mg/dl    Urobilinogen 0.2 0.0 - 1.0 EU/dl    Nitrites Negative NEGATIVE,Negative      Leukocyte Esterase Negative NEGATIVE,Negative      Color Other      Appearance Slightly Cloudy     GLUCOSE, POC    Collection Time: 01/20/15  2:41 AM   Result Value Ref Range    Glucose (POC) 103 65 - 105 mg/dL       Data Review images and reports reviewed    Assessment:     Active Problems:    Small bowel obstruction (HCC) (01/20/2015)        Plan/Recommendations/Medical Decision Making:     Continue present treatment  increase activity, incentive spirometer, trial mineral oil.        Jubal Rademaker [place], MD  January 20, 2015

## 2015-01-20 NOTE — ED Notes (Signed)
Inserted ng tube. 18 french 50 at the tip right nare.

## 2015-01-20 NOTE — H&P (Addendum)
[facility]  Stat History and Physical  NAME:  [name], [name]  SEX:   F  ADMIT: 01/19/2015  DOB:1941-08-06  MR#    [id number]  ROOM:  5123  ACCT#  [id number]    I hereby certify this patient for admission based upon medical necessity as   noted below:    <    DATE OF SERVICE:    01/20/2015.    CHIEF COMPLAINT:  Abdominal pain.    HISTORY OF PRESENT ILLNESS:  The patient is a very pleasant 74 year old female who comes in because of   abdominal pain.  Several days ago, the patient had similar symptoms of   abdominal pain, diarrhea and vomiting.  She had CT scan done which showed   \\"small-bowel obstruction.\\"  She was discharged about a week ago.  The   patient states that since discharge, she has decreased appetite, started to   have increasing abdominal distention.  She could only tolerate small   quantities of soup.  She would have nausea.  She has not had any flatus for   the past several days and due to increasing abdominal pain, she decided to go   to the ER where an abdominal x-ray was done and surgical consultation was done   as well.  We have been called to assist in the patient's admission.    PAST MEDICAL HISTORY:  C-spine arthritis, depression, osteoarthritis, chronic kidney disease,   hypertension.    PAST SURGICAL HISTORY:  Back surgery, knee surgery, hysterectomy, carpal tunnel surgery.    ALLERGIES:  NO KNOWN DRUG ALLERGIES.    HOME MEDICATIONS:  Atenolol, Norvasc, Micardis, Lomotil, Prozac.    SOCIAL HISTORY:  No alcohol or tobacco use.  She is widowed, lives by herself.   [place]    FAMILY HISTORY:  Noncontributory.    REVIEW OF SYSTEMS:  GENERAL:  No fever, night sweats.  HEENT:  No nasal congestion, no nosebleed.  CARDIOVASCULAR:  No chest pain.  RESPIRATORY:  No shortness of breath or cough.  GASTROINTESTINAL:  As stated in HPI.  GENITOURINARY:  Decreased urine output.  NEUROLOGIC:  No focal weakness or numbness.  MUSCULOSKELETAL:  No joint pain.   SKIN:  No itching.  HEMATOLOGIC:  No bleeding problem.    PHYSICAL EXAMINATION:  GENERAL:  The patient in bed.  VITAL SIGNS:  Blood pressure 125/70, heart rate 73, respiratory rate 18,   temperature 98 degrees Fahrenheit, oxygen saturation 97% room air.  HEENT:  Normocephalic, atraumatic.  Nose and mouth:  The patient has NG tube.   NECK:  Supple.  LUNGS:  Clear.  CARDIOVASCULAR:  Regular rhythm.  ABDOMEN:  Soft.  EXTREMITIES:  No edema.  NEUROLOGIC:  Awake, alert, oriented times 3, follows commands, moves all   extremities.    SKIN:  Intact.    LABORATORY EXAMINATION:  Sodium 135, potassium 3, chloride 98, bicarbonate 25, BUN 30, creatinine 2.2,   lipase 292, lactic acid 1.6.  WBC 15, hemoglobin 13, hematocrit 41, platelets   210.  Abdominal x-ray done, results pending.  Images reviewed.    ASSESSMENT AND PLAN:  1.  Small-bowel obstruction.  The patient placed n.p.o.  NG tube placed, IV   fluids, pain management.  Surgical consultation done, appreciate   recommendations.  2.  Hypertension.  While n.p.o., will provide metoprolol IV with parameters.  3.  CKD 4 . Renal dose all medications. Avoid nephrotoxic agents. Monitor   volume status. Check urinalysis  4.  Hypokalemia.  Replace potassium and recheck.  5.  Rule out urinary tract infection.  Check urinalysis.   6.  Deep venous thrombosis prophylaxis. Gastrointestinal prophylaxis.  7.  Further recommendations based on clinical course.  Discussed with patient   current assessment and plan, answered all her questions.    Thank you very much for allowing me to participate in the care of this very pleasant    patient.   ___________________  Knox Royalty MD  Dictated By: .   MN  D:01/20/2015 04:30:23  T: 01/20/2015 04:47:07  [id number]

## 2015-01-20 NOTE — Other (Signed)
Bedside and Verbal shift change report given to JOAN del rosario BERGUNIO, RN   (oncoming nurse) by Nora Mabanglo R.N. (offgoing nurse). Report included the following information SBAR.

## 2015-01-20 NOTE — Progress Notes (Signed)
Pt was seen and examined, labs reviewed,  She is comfortable and her belly exam is normal. I asked the nurse to d/c NGT and start a clear liquid diet  Surgery following    [name] Sharion DoveZapata

## 2015-01-20 NOTE — Progress Notes (Signed)
Tentative dc plan:   Spoke with her in the room and she lives in home with her dtr and will return at d/c. She is unsure at this time of any needs and did not want to make any decisions. At time feels too bad. Will follow.    PCP: Hartley BarefootLaurie A Goldsticker, MD       DME: none    Home Environment: [place] [address]  [place] [id number] [phone]. Lives with her dtr in their 1 story.     Prior to admission open services:    Home Health Agency- none  Personal Care Agency- none    Extended Emergency Contact Information  Primary Emergency Contact: Strong,Maria  Home Phone: [phone]  Relation: Child     Transportation: may need family to transport home. Per pt [facility]      Case Management Assessment      Preferred Language for Healthcare Related Communication     Preferred Language for Healthcare Related Communication: English   Spiritual/Ethnic/Cultural/Religious Needs that Should be Incorporated Into Your Care   Spiritual/Ethnic/Cultural/Religious Needs that Should be Incorporated Into Your Care:  (catholic)      FUNCTIONAL ASSESSMENT    Per pt she was independent with ALL needs prior to admit and drove self to hospital/               Decline in Gait/Transfer/Balance: No       Decline in Capacity to Feed/Dress/Bathe: No   Developmental Delay: No   Chewing/Swallowing Problems: No      DYSPHAGIA SCREENING                          Difficulty with Secretions     Difficulty with Secretions: No      Speech Slurred/Thick/Garbled     Speech Slurred/Thick/Garbled: No      ABUSE/NEGLECT SCREENING   Physical Abuse/Neglect: Denies   Sexual Abuse: Denies   Sexual Abuse: Denies   Other Abuse/Issues: Denies          PRIMARY DECISION MAKER                                        ADVANCE CARE PLANNING (ACP) DOCUMENTS   Confirm Advance Directive: None       Does the patient have other document types: Other (comment) (none)      Suicide/Psychosocial Screening    Primary Diagnosis or Primary Complaint of an Emotional Behavior Disorder: No   Patient is Currently Experiencing Depression: No   Suicidal Ideation/Attempts: No   Homicidal Ideation/Attempts: No   Alcohol/Drug Intoxication: No   Hallucinations/Delusions: No   Pending, Active, or Temporary Detention Orders: No   Aggressive/Inappropriate Behavior: No      SAD PERSONS                                                  READMIT RISK TOOL   Support Systems: None   Relationship with Primary Physician Group: Seen at least one time within past 12 months   History of Falls Within Past 3 Months: Yes   Needs Assistance with Wound Care AND/OR Mgnt of O2, Nebulizer: No   Requires Financial, Physical and/or Educational  Assistance With Medications: No   History of Mental Illness: No   Living Alone: Yes      CARE MANAGEMENT INTERVENTIONS   PCP Verified by CM: Yes           Mode of Transport at Discharge: Self (drove self to hospital lives with her dtr Carmella per pt. May need someone to drive her home.)               Discharge Durable Medical Equipment: No (has cane in home)   Physical Therapy Consult: No   Occupational Therapy Consult: No   Speech Therapy Consult: No   Current Support Network: Own Home, Other (dtr lives with her)           Plan discussed with Pt/Family/Caregiver: Yes   Freedom of Choice Offered: Yes      DISCHARGE LOCATION   Discharge Placement: Home

## 2015-01-20 NOTE — Consults (Signed)
[facility]  CONSULTATION REPORT  NAME:  [name], [name]  SEX:   F  ADMIT: 01/19/2015  DATE OF CONSULT: 01/19/2015  REFERRING PHYSICIAN:    DOB: [birth year]  MR#    [id number]  ROOM:  [id number]  ACCT#  [id number]    cc: Alba CoryLAURIE GOLDSTICKER M.D.    HISTORY OF PRESENT ILLNESS:  The patient is a 74 year old female who presents to the emergency room for   abdominal distension and pain, nausea and vomiting.  The patient was evaluated   in the emergency room and was found to have an elevated white count of 15.2.    X-rays revealed air fluid levels within the abdomen.  Exam and history were   felt to be consistent with  small-bowel obstruction.  Surgical consultation   was requested.    PAST MEDICAL HISTORY:  Notable for an ER visit on 01/10/2015.  She was admitted at that time to the   medicine service.  CT scan had revealed a small-bowel obstruction as well.    The patient was admitted, observed and discharged on 04/06.  The patient   states that she has not had a bowel movement since that discharge.  She denies   any fever or chills.  She denies any dysuria, hematuria, cough or sputum   production.  She has no other complaints at this time.    ALLERGIES:  NONE.    PAST MEDICAL HISTORY:  Notable for hypertension.    PAST SURGICAL HISTORY:  Notable for hysterectomy, bilateral knee replacement and rotator cuff repair.    SOCIAL HISTORY:  Negative tobacco and ETOH.    FAMILY HISTORY:  Notable for father with prostate cancer.  Mother with cancer of uncertain   etiology.  Sister with kidney cancer.    REVIEW OF SYSTEMS:  Review of systems times 10 is negative.    PHYSICAL EXAMINATION:  GENERAL:  Pleasant female in no acute distress.  VITAL SIGNS:  Blood pressure 130/78, temperature 98.5, pulse 72, respirations   16, pulse ox 98% on room air.  Weight is 68 kilograms.  HEENT:  Sclerae clear.  Oropharynx clear.  NECK:  Cervical exam is negative.  RESPIRATORY:  Clear to auscultation, equal bilaterally.   HEART:  Regular rhythm and rate without murmur or gallop.  ABDOMEN:  Decreased bowel sounds, distended, mild diffuse tenderness.  No   guarding, no rebound.  Inguinal exam is negative.  EXTREMITIES:  No clubbing, cyanosis or edema.  Full range of motion times 4.  NEUROLOGIC:  Cranial nerves II-XII intact.  Motor 5/5 times 4.  Sensory intact   to light touch.  The patient has well-healed incisional scars over her knees   as well.  She is alert and oriented times 3.  Memory is intact.    LABORATORY STUDIES:  Reveal a white count of 15.2, hematocrit 41.4, platelet count 210.  Her   chemistries reveal potassium of 3.0, creatinine of 2.2.    IMPRESSION:  1.  Partial small-bowel obstruction.  2.  Hypokalemia.  3.  Azotemia.   4.  History of hypertension.  5.  Status post hysterectomy.    PLAN:  The patient is to be admitted.  NG tube be placed, IV fluid resuscitation and   potassium replacement.  We will follow with serial exams.      ___________________  Stephani PoliceANTONIO J Abram Sax MD  Dictated By:.   LS  D:01/20/2015 00:38:54  T: 01/20/2015 00:52:26  [id number]

## 2015-01-20 NOTE — Other (Signed)
TRANSFER - OUT REPORT:    Verbal report given to nora r.n(name) on [name]  being transferred to 5123(unit) for routine progression of care       Report consisted of patient???s Situation, Background, Assessment and   Recommendations(SBAR).     Information from the following report(s) ED Summary was reviewed with the receiving nurse.    Lines:   Peripheral IV 01/19/15 Right Forearm (Active)   Site Assessment Clean, dry, & intact 01/19/2015  9:53 PM   Phlebitis Assessment 0 01/19/2015  9:53 PM   Infiltration Assessment 0 01/19/2015  9:53 PM   Dressing Status Clean, dry, & intact 01/19/2015  9:53 PM   Dressing Type Transparent 01/19/2015  9:53 PM   Hub Color/Line Status Blue;Flushed 01/19/2015  9:53 PM        Opportunity for questions and clarification was provided.      Patient transported with:   Patient???s medications from home

## 2015-01-20 NOTE — Progress Notes (Signed)
Dr. Sharion DoveZapata  made aware Lab.result K+-3.1,MD made a telephone order to give potassium p.o.

## 2015-01-20 NOTE — Consults (Signed)
Date of Surgery Update:  [name] was seen and examined.  History and physical has been reviewed. The patient has been examined .  Consult dictated    Signed By: Silvano BilisAntonio Jose Amy Belloso, MD     January 20, 2015 12:47 AM

## 2015-01-20 NOTE — Other (Signed)
TRANSFER - IN REPORT:    Verbal report received from ricardo duncker  (name) on Val EagleCarmen G Forrester  being received from ED  (unit) for routine progression of care      Report consisted of patient???s Situation, Background, Assessment and   Recommendations(SBAR).     Information from the following report(s) SBAR was reviewed with the receiving nurse.    Opportunity for questions and clarification was provided.      Assessment completed upon patient???s arrival to unit and care assumed.

## 2015-01-21 MED ORDER — MORPHINE 2 MG/ML INJECTION
2 mg/mL | INTRAMUSCULAR | Status: DC | PRN
Start: 2015-01-21 — End: 2015-01-25
  Administered 2015-01-21 – 2015-01-24 (×8): via INTRAVENOUS

## 2015-01-21 MED ORDER — MORPHINE 2 MG/ML INJECTION
2 mg/mL | INTRAMUSCULAR | Status: DC | PRN
Start: 2015-01-21 — End: 2015-01-21

## 2015-01-21 MED ORDER — SIMETHICONE 80 MG CHEWABLE TAB
80 mg | Freq: Once | ORAL | Status: AC
Start: 2015-01-21 — End: 2015-01-20
  Administered 2015-01-21: 03:00:00 via ORAL

## 2015-01-21 MED ORDER — MAGNESIUM HYDROXIDE 400 MG/5 ML ORAL SUSP
400 mg/5 mL | Freq: Every day | ORAL | Status: DC | PRN
Start: 2015-01-21 — End: 2015-01-25
  Administered 2015-01-21: 21:00:00 via ORAL

## 2015-01-21 MED FILL — GAS-X 80 MG CHEWABLE TABLET: 80 mg | ORAL | Qty: 1

## 2015-01-21 MED FILL — MILK OF MAGNESIA 400 MG/5 ML ORAL SUSPENSION: 400 mg/5 mL | ORAL | Qty: 30

## 2015-01-21 MED FILL — HEPARIN (PORCINE) 5,000 UNIT/ML IJ SOLN: 5000 unit/mL | INTRAMUSCULAR | Qty: 1

## 2015-01-21 MED FILL — MORPHINE 4 MG/ML SYRINGE: 4 mg/mL | INTRAMUSCULAR | Qty: 1

## 2015-01-21 MED FILL — MORPHINE 2 MG/ML INJECTION: 2 mg/mL | INTRAMUSCULAR | Qty: 1

## 2015-01-21 MED FILL — PROTONIX 40 MG INTRAVENOUS SOLUTION: 40 mg | INTRAVENOUS | Qty: 40

## 2015-01-21 MED FILL — ONDANSETRON (PF) 4 MG/2 ML INJECTION: 4 mg/2 mL | INTRAMUSCULAR | Qty: 2

## 2015-01-21 NOTE — Progress Notes (Signed)
Progress Note    Patient: [name] EagleCarmen G Correa MRN: [id number]  SSN: [id number]    Date of Birth: 03/04/1941  Age: 74 y.o.  Sex: female      Admit Date: 01/19/2015    * No surgery found *    Procedure:      Subjective:     Patient has complaints of bloating and gas pains..  She states she is tolerating CLD without nausea but no BM or flatus.  NG removed by medical service last night.    Objective:     Visit Vitals   Item Reading   ??? BP 110/63 mmHg   ??? Pulse 80   ??? Temp 98.5 ??F (36.9 ??C)   ??? Resp 18   ??? Ht 5' (1.524 m)   ??? Wt 68.04 kg (150 lb)   ??? BMI 29.30 kg/m2   ??? SpO2 99%   ??? Breastfeeding No       Temp (24hrs), Avg:98.5 ??F (36.9 ??C), Min:98.4 ??F (36.9 ??C), Max:98.6 ??F (37 ??C)      Physical Exam:    ABDOMEN: Soft, NT, Mod Distended.  No guarding or rebound.    Data Review: None taken.    Lab Review:   BMP: No results found for: NA, K, CL, CO2, AGAP, GLU, BUN, CREA, GFRAA, GFRNA  CBC: No results found for: WBC, HGB, HGBEXT, HCT, HCTEXT, PLT, PLTEXT, HGBEXT, HCTEXT, PLTEXT    Assessment:     Hospital Problems  Never Reviewed          Codes Class Noted POA    Small bowel obstruction (HCC) ICD-10-CM: K56.69  ICD-9-CM: 560.9  01/20/2015 Unknown              Plan/Recommendations/Medical Decision Making:     Continue present treatment per IM.  No acute surgical indications.  Please call with questions.    Signed By: Holly BodilyStephen Zenaya Ulatowski, MD     January 21, 2015

## 2015-01-21 NOTE — Progress Notes (Signed)
Progress Note    Patient: [name] MRN: [id number]  SSN: [id number]    Date of Birth: April 25, 1941  Age: 74 y.o.  Sex: female      Admit Date: 01/19/2015    LOS: 1 day     Subjective:   [name] is 74 y/o F admitted with sbo, she denies nausea or vomiting, she tolerated some jello but she reports she has a hard time taking in the clear liquids.  She reports gas pain but she denies passing any flatus or having a BM.    She denies cp, sob.               Objective:     Current Facility-Administered Medications   Medication Dose Route Frequency   ??? heparin (porcine) injection 5,000 Units  5,000 Units SubCUTAneous Q12H   ??? ondansetron (ZOFRAN) injection 4 mg  4 mg IntraVENous Q6H PRN   ??? phenol throat spray (CHLORASEPTIC) 1 Spray  1 Spray Oral PRN   ??? pantoprazole (PROTONIX) 40 mg in sodium chloride 0.9 % 10 mL injection  40 mg IntraVENous DAILY   ??? acetaminophen (TYLENOL) suppository 650 mg  650 mg Rectal Q4H PRN   ??? promethazine (PHENERGAN) 12.5 mg in NS 50 mL IVPB  12.5 mg IntraVENous Q6H PRN   ??? metoprolol (LOPRESSOR) 2.5 mg in 0.9% sodium chloride 25 mL IVPB  2.5 mg IntraVENous Q6H PRN       Visit Vitals   Item Reading   ??? BP 110/63 mmHg   ??? Pulse 80   ??? Temp 98.5 ??F (36.9 ??C)   ??? Resp 18   ??? Ht 5' (1.524 m)   ??? Wt 68.04 kg (150 lb)   ??? BMI 29.30 kg/m2   ??? SpO2 99%   ??? Breastfeeding No       Intake and Output:  Current Shift:  04/16 0701 - 04/16 1900  In: 360 [P.O.:360]  Out: -     Last three shifts:  04/14 1901 - 04/16 0700  In: 2205 [P.O.:200; I.V.:1945]  Out: 1325 [Urine:1175]    General A+Ox3; NAD  Head: Normocephalic, without obvious abnormality, atraumatic   Neck: supple, trachea midline   Lungs: clear to auscultation bilaterally   Heart: regular rate and rhythm, S1, S2 normal, no murmur, click, rub or gallop   Abdomen: soft,  Mild tenderness to palpation, no distention,   Extremities: extremities normal, atraumatic, no cyanosis or edema   Skin: Skin color, texture, turgor normal. No rashes or lesions    Neurologic: Grossly normal    Lab/Data Review:  No results found for this or any previous visit (from the past 24 hour(s)).    Imaging:   @IMAGES @      Assessment/Plan:     Active Problems:    Small bowel obstruction (HCC) (01/20/2015)    continue clear liquids  Morphine PRN Q 4 hours for pain  IVF  zofran  Out of bed to ambulate as tolerated     Anticipated Date of Discharge: 04/18  Anticipated Disposition (home, SNF) : Home    Time spent with the patient and reviewing records, 25 mins.     Belita Warsame Y. Sharion DoveZapata, MD  January 21, 2015  [facility]

## 2015-01-21 NOTE — Progress Notes (Signed)
Patient c/o abdominal /cramping pain ,called Dr.Zapata and made aware and acknowledged.

## 2015-01-21 NOTE — Other (Signed)
Bedside shift change report given to ARMELA C. SAQUILAYAN, RN (oncoming nurse) by ATHELIA,RN (offgoing nurse). Report included the following information SBAR, Kardex and MAR.

## 2015-01-22 LAB — CBC WITH AUTOMATED DIFF
ATYPICAL LYMPHS: 0.9 % — ABNORMAL HIGH (ref 0–0)
EOSINOPHILS: 1.9 % (ref 0–5)
HCT: 35.4 % — ABNORMAL LOW (ref 37.0–50.0)
HGB: 11 gm/dl — ABNORMAL LOW (ref 13.0–17.2)
LYMPHOCYTES: 31.2 % (ref 28–48)
MCH: 31.6 pg (ref 25.4–34.6)
MCHC: 31.1 gm/dl (ref 30.0–36.0)
MCV: 101.7 fL — ABNORMAL HIGH (ref 80.0–98.0)
MONOCYTES: 15.3 % — ABNORMAL HIGH (ref 1–13)
MPV: 13.7 fL — ABNORMAL HIGH (ref 6.0–10.0)
NEUTROPHILS: 44.3 % (ref 34–64)
PLATELET COMMENTS: NORMAL
PLATELET: 159 10*3/uL (ref 140–450)
RBC: 3.48 M/uL — ABNORMAL LOW (ref 3.60–5.20)
RDW-SD: 44.9 (ref 36.4–46.3)
WBC: 12.2 10*3/uL — ABNORMAL HIGH (ref 4.0–11.0)

## 2015-01-22 LAB — METABOLIC PANEL, BASIC
BUN: 23 mg/dl (ref 7–25)
CO2: 25 mEq/L (ref 21–32)
Calcium: 9 mg/dl (ref 8.5–10.1)
Chloride: 104 mEq/L (ref 98–107)
Creatinine: 1.3 mg/dl (ref 0.6–1.3)
GFR est AA: 51
GFR est non-AA: 42
Glucose: 107 mg/dl — ABNORMAL HIGH (ref 74–106)
Potassium: 3.4 mEq/L — ABNORMAL LOW (ref 3.5–5.1)
Sodium: 138 mEq/L (ref 136–145)

## 2015-01-22 LAB — C. DIFFICILE/EPI PCR

## 2015-01-22 MED ORDER — SODIUM CHLORIDE 0.45 % IV
0.45 % | INTRAVENOUS | Status: DC
Start: 2015-01-22 — End: 2015-01-25
  Administered 2015-01-22 – 2015-01-25 (×5): via INTRAVENOUS

## 2015-01-22 MED ORDER — METRONIDAZOLE IN SODIUM CHLORIDE (ISO-OSM) 500 MG/100 ML IV PIGGY BACK
500 mg/100 mL | Freq: Three times a day (TID) | INTRAVENOUS | Status: DC
Start: 2015-01-22 — End: 2015-01-25
  Administered 2015-01-22 – 2015-01-25 (×9): via INTRAVENOUS

## 2015-01-22 MED ORDER — SODIUM CHLORIDE 0.9 % IJ SYRG
INTRAMUSCULAR | Status: AC
Start: 2015-01-22 — End: 2015-01-22

## 2015-01-22 MED FILL — MORPHINE 2 MG/ML INJECTION: 2 mg/mL | INTRAMUSCULAR | Qty: 1

## 2015-01-22 MED FILL — ONDANSETRON (PF) 4 MG/2 ML INJECTION: 4 mg/2 mL | INTRAMUSCULAR | Qty: 2

## 2015-01-22 MED FILL — HEPARIN (PORCINE) 5,000 UNIT/ML IJ SOLN: 5000 unit/mL | INTRAMUSCULAR | Qty: 1

## 2015-01-22 MED FILL — METRONIDAZOLE IN SODIUM CHLORIDE (ISO-OSM) 500 MG/100 ML IV PIGGY BACK: 500 mg/100 mL | INTRAVENOUS | Qty: 100

## 2015-01-22 MED FILL — BD POSIFLUSH NORMAL SALINE 0.9 % INJECTION SYRINGE: INTRAMUSCULAR | Qty: 10

## 2015-01-22 MED FILL — SODIUM CHLORIDE 0.45 % IV: 0.45 % | INTRAVENOUS | Qty: 1000

## 2015-01-22 MED FILL — SODIUM CHLORIDE 0.9 % INJECTION: INTRAMUSCULAR | Qty: 10

## 2015-01-22 NOTE — Progress Notes (Signed)
Progress Note    Patient: [name] MRN: [id number]  SSN: [id number]    Date of Birth: September 02, 1941  Age: 74 y.o.  Sex: female      Admit Date: 01/19/2015    LOS: 2 days     Subjective:   [name] is 74 y/o F admitted with sbo, she denies nausea or vomiting, she tolerated some jello but she reports she has a hard time taking in the clear liquids.  She reports gas pain but she denies passing any flatus or having a BM.    She denies cp, sob.               04/17  Pt with large BM this morning, +diarrhea. Pt reports abdominal pain and feeling inflamed. She reports nausea si improved.      Objective:     Current Facility-Administered Medications   Medication Dose Route Frequency   ??? metroNIDAZOLE (FLAGYL) IVPB premix 500 mg  500 mg IntraVENous Q8H   ??? 0.45% sodium chloride infusion  75 mL/hr IntraVENous CONTINUOUS   ??? morphine injection 2 mg  2 mg IntraVENous Q4H PRN   ??? magnesium hydroxide (MILK OF MAGNESIA) oral suspension 30 mL  30 mL Oral DAILY PRN   ??? heparin (porcine) injection 5,000 Units  5,000 Units SubCUTAneous Q12H   ??? ondansetron (ZOFRAN) injection 4 mg  4 mg IntraVENous Q6H PRN   ??? phenol throat spray (CHLORASEPTIC) 1 Spray  1 Spray Oral PRN   ??? pantoprazole (PROTONIX) 40 mg in sodium chloride 0.9 % 10 mL injection  40 mg IntraVENous DAILY   ??? acetaminophen (TYLENOL) suppository 650 mg  650 mg Rectal Q4H PRN   ??? promethazine (PHENERGAN) 12.5 mg in NS 50 mL IVPB  12.5 mg IntraVENous Q6H PRN   ??? metoprolol (LOPRESSOR) 2.5 mg in 0.9% sodium chloride 25 mL IVPB  2.5 mg IntraVENous Q6H PRN       Visit Vitals   Item Reading   ??? BP 97/81 mmHg   ??? Pulse 81   ??? Temp 99 ??F (37.2 ??C)   ??? Resp 81   ??? Ht 5' (1.524 m)   ??? Wt 68.04 kg (150 lb)   ??? BMI 29.30 kg/m2   ??? SpO2 100%   ??? Breastfeeding No       Intake and Output:  Current Shift:  04/17 0701 - 04/17 1900  In: 360 [P.O.:360]  Out: 351 [Urine:350]    Last three shifts:  04/15 1901 - 04/17 0700  In: 3230 [P.O.:940; I.V.:2290]  Out: 1600 [Urine:1600]     General A+Ox3; NAD  Head: Normocephalic, without obvious abnormality, atraumatic   Neck: supple, trachea midline   Lungs: clear to auscultation bilaterally   Heart: regular rate and rhythm, S1, S2 normal, no murmur, click, rub or gallop   Abdomen: soft,  Mild tenderness to palpation, no distention,   Extremities: extremities normal, atraumatic, no cyanosis or edema   Skin: Skin color, texture, turgor normal. No rashes or lesions   Neurologic: Grossly normal    Lab/Data Review:  Recent Results (from the past 24 hour(s))   C. DIFFICILE/EPI PCR    Collection Time: 01/22/15 10:11 AM   Result Value Ref Range    C. diff toxin by PCR Toxigenic C. difficile POSITIVE (A) Toxigenic C. difficile NEGATIVE         Imaging:   @IMAGES @      Assessment/Plan:     Active Problems:    Small bowel obstruction (  HCC) (01/20/2015)    continue clear liquids  Morphine PRN Q 4 hours for pain  IVF  zofran  Out of bed to ambulate as tolerated     04/17  Stool for Cdiff  If positive, start Flagyl and continue oral clear liquids   IVF  Zofran    Anticipated Date of Discharge: 04/18  Anticipated Disposition (home, SNF) : Home    Time spent with the patient and reviewing records, 25 mins.     Kassim Guertin Y. Sharion Dove, MD  January 22, 2015  [facility]

## 2015-01-22 NOTE — Other (Signed)
Bedside shift change report given to ARMELA C. SAQUILAYAN, RN (oncoming nurse) by DENISE,RN (offgoing nurse). Report included the following information SBAR, Kardex and MAR.

## 2015-01-23 MED FILL — METRONIDAZOLE IN SODIUM CHLORIDE (ISO-OSM) 500 MG/100 ML IV PIGGY BACK: 500 mg/100 mL | INTRAVENOUS | Qty: 100

## 2015-01-23 MED FILL — MORPHINE 2 MG/ML INJECTION: 2 mg/mL | INTRAMUSCULAR | Qty: 1

## 2015-01-23 MED FILL — SODIUM CHLORIDE 0.9 % INJECTION: INTRAMUSCULAR | Qty: 10

## 2015-01-23 MED FILL — HEPARIN (PORCINE) 5,000 UNIT/ML IJ SOLN: 5000 unit/mL | INTRAMUSCULAR | Qty: 1

## 2015-01-23 NOTE — Other (Signed)
Bedside and Verbal shift change report given to Phyllis Williams, RN (oncoming nurse) by Carmelo Calimlim, RN (offgoing nurse). Report included the following information SBAR and Kardex.

## 2015-01-23 NOTE — Other (Signed)
Bedside shift change report given to Arnel RN (oncoming nurse) by Aurea RN (offgoing nurse). Report included the following information SBAR and Kardex.

## 2015-01-23 NOTE — Progress Notes (Signed)
Progress Note    Patient: [name] MRN: [id number]  SSN: [id number]    Date of Birth: Jul 19, 1941  Age: 74 y.o.  Sex: female      Admit Date: 01/19/2015    LOS: 3 days     Subjective:   [name] is 74 y/o F admitted with sbo, she denies nausea or vomiting, she tolerated some jello but she reports she has a hard time taking in the clear liquids.  She reports gas pain but she denies passing any flatus or having a BM.    She denies cp, sob.               04/17  Pt with large BM this morning, +diarrhea. Pt reports abdominal pain and feeling inflamed. She reports nausea si improved.      04/18  Pt with episodes of diarrhea, discussed with the nurse to hold all laxative.  Pt wants to be discharge but she has symptomatic C-diff and she needs to be clinically stable before discharge.    Objective:     Current Facility-Administered Medications   Medication Dose Route Frequency   ??? metroNIDAZOLE (FLAGYL) IVPB premix 500 mg  500 mg IntraVENous Q8H   ??? 0.45% sodium chloride infusion  75 mL/hr IntraVENous CONTINUOUS   ??? morphine injection 2 mg  2 mg IntraVENous Q4H PRN   ??? magnesium hydroxide (MILK OF MAGNESIA) oral suspension 30 mL  30 mL Oral DAILY PRN   ??? heparin (porcine) injection 5,000 Units  5,000 Units SubCUTAneous Q12H   ??? ondansetron (ZOFRAN) injection 4 mg  4 mg IntraVENous Q6H PRN   ??? phenol throat spray (CHLORASEPTIC) 1 Spray  1 Spray Oral PRN   ??? pantoprazole (PROTONIX) 40 mg in sodium chloride 0.9 % 10 mL injection  40 mg IntraVENous DAILY   ??? acetaminophen (TYLENOL) suppository 650 mg  650 mg Rectal Q4H PRN   ??? promethazine (PHENERGAN) 12.5 mg in NS 50 mL IVPB  12.5 mg IntraVENous Q6H PRN   ??? metoprolol (LOPRESSOR) 2.5 mg in 0.9% sodium chloride 25 mL IVPB  2.5 mg IntraVENous Q6H PRN       Visit Vitals   Item Reading   ??? BP 128/68 mmHg   ??? Pulse 83   ??? Temp 98.3 ??F (36.8 ??C)   ??? Resp 18   ??? Ht 5' (1.524 m)   ??? Wt 68.04 kg (150 lb)   ??? BMI 29.30 kg/m2   ??? SpO2 98%   ??? Breastfeeding No        Intake and Output:  Current Shift:  04/18 0701 - 04/18 1900  In: 240 [P.O.:240]  Out: 280 [Urine:280]    Last three shifts:  04/16 1901 - 04/18 0700  In: 2278.8 [P.O.:960; I.V.:1318.8]  Out: 1102 [Urine:1100]    General A+Ox3; NAD  Head: Normocephalic, without obvious abnormality, atraumatic   Neck: supple, trachea midline   Lungs: clear to auscultation bilaterally   Heart: regular rate and rhythm, S1, S2 normal, no murmur, click, rub or gallop   Abdomen: soft,  Mild tenderness to palpation, no distention,   Extremities: extremities normal, atraumatic, no cyanosis or edema   Skin: Skin color, texture, turgor normal. No rashes or lesions   Neurologic: Grossly normal    Lab/Data Review:  Recent Results (from the past 24 hour(s))   CBC WITH AUTOMATED DIFF    Collection Time: 01/22/15  3:55 PM   Result Value Ref Range    WBC 12.2 (H) 4.0 -  11.0 1000/mm3    RBC 3.48 (L) 3.60 - 5.20 M/uL    HGB 11.0 (L) 13.0 - 17.2 gm/dl    HCT 16.1 (L) 09.6 - 50.0 %    MCV 101.7 (H) 80.0 - 98.0 fL    MCH 31.6 25.4 - 34.6 pg    MCHC 31.1 30.0 - 36.0 gm/dl    PLATELET 045 409 - 811 1000/mm3    MPV 13.7 (H) 6.0 - 10.0 fL    RDW-SD 44.9 36.4 - 46.3      NEUTROPHILS 44.3 34 - 64 %    LYMPHOCYTES 31.2 28 - 48 %    ATYPICAL LYMPHS 0.9 (H) 0 - 0 %    MONOCYTES 15.3 (H) 1 - 13 %    EOSINOPHILS 1.9 0 - 5 %    Poikilocytosis 1+      Macrocytes 1+      Schistocytes OCCASIONAL      Elliptocytes OCCASIONAL      Crenated RBCs 1+      PLATELET COMMENTS NORMAL     METABOLIC PANEL, BASIC    Collection Time: 01/22/15  3:55 PM   Result Value Ref Range    Sodium 138 136 - 145 mEq/L    Potassium 3.4 (L) 3.5 - 5.1 mEq/L    Chloride 104 98 - 107 mEq/L    CO2 25 21 - 32 mEq/L    Glucose 107 (H) 74 - 106 mg/dl    BUN 23 7 - 25 mg/dl    Creatinine 1.3 0.6 - 1.3 mg/dl    GFR est AA 91.4      GFR est non-AA 42      Calcium 9.0 8.5 - 10.1 mg/dl       Imaging:   @      Assessment/Plan:     Active Problems:    Small bowel obstruction (HCC) (01/20/2015)     continue clear liquids  Morphine PRN Q 4 hours for pain  IVF  zofran  Out of bed to ambulate as tolerated     04/17  Stool for Cdiff  If positive, start Flagyl and continue oral clear liquids   IVF  Zofran      04/18  Continue IV flagyl  Clear liquids  Pain meds    Anticipated Date of Discharge: 04/20  Anticipated Disposition (home, SNF) : Home    Time spent with the patient and reviewing records, 25 mins.     Najir Roop Y. Sharion Dove, MD  January 23, 2015  [facility]

## 2015-01-24 LAB — METABOLIC PANEL, BASIC
BUN: 12 mg/dl (ref 7–25)
CO2: 21 mEq/L (ref 21–32)
Calcium: 8.7 mg/dl (ref 8.5–10.1)
Chloride: 107 mEq/L (ref 98–107)
Creatinine: 1.2 mg/dl (ref 0.6–1.3)
GFR est AA: 56
GFR est non-AA: 46
Glucose: 94 mg/dl (ref 74–106)
Potassium: 3.2 mEq/L — ABNORMAL LOW (ref 3.5–5.1)
Sodium: 138 mEq/L (ref 136–145)

## 2015-01-24 LAB — CBC WITH AUTOMATED DIFF
BASOPHILS: 0.2 % (ref 0–3)
EOSINOPHILS: 0.8 % (ref 0–5)
HCT: 31.3 % — ABNORMAL LOW (ref 37.0–50.0)
HGB: 10.2 gm/dl — ABNORMAL LOW (ref 13.0–17.2)
IMMATURE GRANULOCYTES: 1.1 % (ref 0.0–3.0)
LYMPHOCYTES: 34.4 % (ref 28–48)
MCH: 32.5 pg (ref 25.4–34.6)
MCHC: 32.6 gm/dl (ref 30.0–36.0)
MCV: 99.7 fL — ABNORMAL HIGH (ref 80.0–98.0)
MONOCYTES: 8.9 % (ref 1–13)
MPV: 13.2 fL — ABNORMAL HIGH (ref 6.0–10.0)
NEUTROPHILS: 54.6 % (ref 34–64)
NRBC: 0 (ref 0–0)
PLATELET: 184 10*3/uL (ref 140–450)
RBC: 3.14 M/uL — ABNORMAL LOW (ref 3.60–5.20)
RDW-SD: 43.9 (ref 36.4–46.3)
WBC: 16 10*3/uL — ABNORMAL HIGH (ref 4.0–11.0)

## 2015-01-24 MED ORDER — VANCOMYCIN ORAL SOLUTION 100 MG/ML CPD
100 mg/ml | Freq: Four times a day (QID) | ORAL | Status: DC
Start: 2015-01-24 — End: 2015-01-25
  Administered 2015-01-24 – 2015-01-25 (×4): via ORAL

## 2015-01-24 MED ORDER — CITALOPRAM 20 MG TAB
20 mg | Freq: Every evening | ORAL | Status: DC
Start: 2015-01-24 — End: 2015-01-25
  Administered 2015-01-24: 22:00:00 via ORAL

## 2015-01-24 MED ORDER — TEMAZEPAM 15 MG CAP
15 mg | Freq: Once | ORAL | Status: AC
Start: 2015-01-24 — End: 2015-01-24
  Administered 2015-01-24: 05:00:00 via ORAL

## 2015-01-24 MED ORDER — SODIUM CHLORIDE 0.9 % IJ SYRG
INTRAMUSCULAR | Status: AC
Start: 2015-01-24 — End: 2015-01-24
  Administered 2015-01-24: 14:00:00

## 2015-01-24 MED ORDER — SODIUM CHLORIDE 0.9 % IJ SYRG
INTRAMUSCULAR | Status: AC
Start: 2015-01-24 — End: 2015-01-25

## 2015-01-24 MED FILL — MORPHINE 2 MG/ML INJECTION: 2 mg/mL | INTRAMUSCULAR | Qty: 1

## 2015-01-24 MED FILL — HEPARIN (PORCINE) 5,000 UNIT/ML IJ SOLN: 5000 unit/mL | INTRAMUSCULAR | Qty: 1

## 2015-01-24 MED FILL — METRONIDAZOLE IN SODIUM CHLORIDE (ISO-OSM) 500 MG/100 ML IV PIGGY BACK: 500 mg/100 mL | INTRAVENOUS | Qty: 100

## 2015-01-24 MED FILL — TEMAZEPAM 15 MG CAP: 15 mg | ORAL | Qty: 1

## 2015-01-24 MED FILL — VANCOMYCIN ORAL SOLUTION 100 MG/ML CPD: 100 mg/ml | ORAL | Qty: 1.25

## 2015-01-24 MED FILL — BD POSIFLUSH NORMAL SALINE 0.9 % INJECTION SYRINGE: INTRAMUSCULAR | Qty: 20

## 2015-01-24 MED FILL — PROTONIX 40 MG INTRAVENOUS SOLUTION: 40 mg | INTRAVENOUS | Qty: 40

## 2015-01-24 MED FILL — CITALOPRAM 20 MG TAB: 20 mg | ORAL | Qty: 1

## 2015-01-24 MED FILL — SODIUM CHLORIDE 0.9 % INJECTION: INTRAMUSCULAR | Qty: 10

## 2015-01-24 MED FILL — ONDANSETRON (PF) 4 MG/2 ML INJECTION: 4 mg/2 mL | INTRAMUSCULAR | Qty: 2

## 2015-01-24 NOTE — Other (Signed)
Bedside and Verbal shift change report given to Phyllis Williams, RN (oncoming nurse) by Carmelo Calimlim, RN (offgoing nurse). Report included the following information SBAR and Kardex.

## 2015-01-24 NOTE — Progress Notes (Signed)
Progress Note    Patient: [name] MRN: [id number]  SSN: [id number]    Date of Birth: April 13, 1941  Age: 74 y.o.  Sex: female      Admit Date: 01/19/2015    LOS: 4 days     Subjective:   Mrs. Dark is 74 y/o F admitted with sbo, she denies nausea or vomiting, she tolerated some jello but she reports she has a hard time taking in the clear liquids.  She reports gas pain but she denies passing any flatus or having a BM.    She denies cp, sob.               04/17  Pt with large BM this morning, +diarrhea. Pt reports abdominal pain and feeling inflamed. She reports nausea si improved.      04/18  Pt with episodes of diarrhea, discussed with the nurse to hold all laxative.  Pt wants to be discharge but she has symptomatic C-diff and she needs to be clinically stable before discharge.    04/19   Pt sitting on the bed, she reports severe diarrhea this morning, she is trying to keep clear liquids diet. She denies severe pain, cp, sob.     She is anxious and worried about her hospital stay, plan discussed with her.     Objective:     Current Facility-Administered Medications   Medication Dose Route Frequency   ??? metroNIDAZOLE (FLAGYL) IVPB premix 500 mg  500 mg IntraVENous Q8H   ??? 0.45% sodium chloride infusion  75 mL/hr IntraVENous CONTINUOUS   ??? morphine injection 2 mg  2 mg IntraVENous Q4H PRN   ??? magnesium hydroxide (MILK OF MAGNESIA) oral suspension 30 mL  30 mL Oral DAILY PRN   ??? heparin (porcine) injection 5,000 Units  5,000 Units SubCUTAneous Q12H   ??? ondansetron (ZOFRAN) injection 4 mg  4 mg IntraVENous Q6H PRN   ??? phenol throat spray (CHLORASEPTIC) 1 Spray  1 Spray Oral PRN   ??? pantoprazole (PROTONIX) 40 mg in sodium chloride 0.9 % 10 mL injection  40 mg IntraVENous DAILY   ??? acetaminophen (TYLENOL) suppository 650 mg  650 mg Rectal Q4H PRN   ??? promethazine (PHENERGAN) 12.5 mg in NS 50 mL IVPB  12.5 mg IntraVENous Q6H PRN   ??? metoprolol (LOPRESSOR) 2.5 mg in 0.9% sodium chloride 25 mL IVPB  2.5 mg  IntraVENous Q6H PRN       Visit Vitals   Item Reading   ??? BP 147/74 mmHg   ??? Pulse 107   ??? Temp 98.1 ??F (36.7 ??C)   ??? Resp 18   ??? Ht 5' (1.524 m)   ??? Wt 68.04 kg (150 lb)   ??? BMI 29.30 kg/m2   ??? SpO2 99%   ??? Breastfeeding No       Intake and Output:  Current Shift:  04/18 1901 - 04/19 0700  In: 550 [I.V.:550]  Out: -     Last three shifts:  04/17 0701 - 04/18 1900  In: 4018.8 [P.O.:1800; I.V.:2218.8]  Out: 2802 [Urine:2800]    General A+Ox3; NAD  Head: Normocephalic, without obvious abnormality, atraumatic   Neck: supple, trachea midline   Lungs: clear to auscultation bilaterally   Heart: regular rate and rhythm, S1, S2 normal, no murmur, click, rub or gallop   Abdomen: soft,  Mild tenderness to palpation, no distention,   Extremities: extremities normal, atraumatic, no cyanosis or edema   Skin: Skin color, texture, turgor normal. No  rashes or lesions   Neurologic: Grossly normal  Psych: pt is very anxious, this does not seem to be new  Lab/Data Review:  No results found for this or any previous visit (from the past 24 hour(s)).    Imaging:   @IMAGES @      Assessment/Plan:     Active Problems:    Small bowel obstruction (HCC) (01/20/2015)    continue clear liquids  Morphine PRN Q 4 hours for pain  IVF  zofran  Out of bed to ambulate as tolerated     04/17  Stool for Cdiff  If positive, start Flagyl and continue oral clear liquids   IVF  Zofran      04/18  Continue IV flagyl  Clear liquids  Pain meds    04/19  Start vanco orally Q 6 hours  flaggyl Q 8 hours  Continue clear liquids  Start celexa tonight small dose.     Anticipated Date of Discharge: 04/20  Anticipated Disposition (home, SNF) : Home    Time spent with the patient and reviewing records, 30 mins.     Bo Teicher Y. Sharion DoveZapata, MD  January 24, 2015  [facility]

## 2015-01-25 MED ORDER — TEMAZEPAM 15 MG CAP
15 mg | Freq: Every evening | ORAL | Status: DC | PRN
Start: 2015-01-25 — End: 2015-01-25
  Administered 2015-01-25: 03:00:00 via ORAL

## 2015-01-25 MED ORDER — METRONIDAZOLE 500 MG TAB
500 mg | ORAL_TABLET | Freq: Three times a day (TID) | ORAL | Status: AC
Start: 2015-01-25 — End: ?

## 2015-01-25 MED ORDER — CITALOPRAM 10 MG TAB
10 mg | ORAL_TABLET | Freq: Every evening | ORAL | Status: AC
Start: 2015-01-25 — End: ?

## 2015-01-25 MED FILL — METRONIDAZOLE IN SODIUM CHLORIDE (ISO-OSM) 500 MG/100 ML IV PIGGY BACK: 500 mg/100 mL | INTRAVENOUS | Qty: 100

## 2015-01-25 MED FILL — VANCOMYCIN ORAL SOLUTION 100 MG/ML CPD: 100 mg/ml | ORAL | Qty: 1.25

## 2015-01-25 MED FILL — SODIUM CHLORIDE 0.9 % INJECTION: INTRAMUSCULAR | Qty: 10

## 2015-01-25 MED FILL — HEPARIN (PORCINE) 5,000 UNIT/ML IJ SOLN: 5000 unit/mL | INTRAMUSCULAR | Qty: 1

## 2015-01-25 MED FILL — TEMAZEPAM 15 MG CAP: 15 mg | ORAL | Qty: 1

## 2015-01-25 NOTE — Other (Signed)
Bedside shift change report given to Lina (oncoming nurse) by Phyllis (offgoing nurse). Report included the following information SBAR.

## 2015-01-25 NOTE — Progress Notes (Signed)
D/c to home today and no needs addressed with this CM nor CM dept. D/c instructions per MD and d/c RN to pt. See EPIC for follow up care and appointments as noted.

## 2015-01-25 NOTE — Discharge Summary (Signed)
Physician Discharge Summary     Patient ID:  [name]  [id number]  [id number]  74 y.o.  1941/05/07    Admit date: 01/19/2015    Discharge date and time: No discharge date for patient encounter.    Primary Care Physician: Hartley BarefootLaurie A Goldsticker, MD    Admission Diagnoses: Small bowel obstruction [facility](HCC)    Discharge Diagnoses: SBO, C-diff colitis, dehydration, ARF    Discharged Condition: Stable        History of Present Illness:   [name] was admitted with SBO, she was treated conservatively, the third day the pt started having explosive diarrhea, abdominal pain. C-diff was checked and it came back positive. She was treated with IV flagyl, she was having multiple BM a day which was diarrhea, she was on clear liquids. Today she reports no diarrhea since yesterday very early. She reports one small bm this morning with no diarrhea.  Pt is ready for discharge    Pt was started on Celexa 10 mg daily for anxiety, she has been taking it for >2 days now and she is feeling OK, no adverse effect reported.       Pt instructed to return to the hospital if fever, chills, abdominal pain, distention.      PE:   General: pt is feeling comfortable, NAD  Lungs: CBTA  Heart: S1S2 rrr  Abdomen: soft, ND, +mild tenderness on LLQ, +BS            Hospital Course: See above.      Visit Vitals   Item Reading   ??? BP 132/67 mmHg   ??? Pulse 91   ??? Temp 98.3 ??F (36.8 ??C)   ??? Resp 18   ??? Ht 5' (1.524 m)   ??? Wt 68.04 kg (150 lb)   ??? BMI 29.30 kg/m2   ??? SpO2 99%   ??? Breastfeeding No       Disposition: Home with office f/u   Patient Instructions:   Current Discharge Medication List      CONTINUE these medications which have NOT CHANGED    Details   diphenoxylate-atropine (LOMOTIL) 2.5-0.025 mg per tablet Take 1 Tab by mouth four (4) times daily as needed for Diarrhea. Max Daily Amount: 4 Tabs.  Qty: 50 Tab, Refills: 0      atenolol (TENORMIN) 100 mg tablet Take 100 mg by mouth daily.      amLODIPine (NORVASC) 2.5 mg tablet Take 2.5 mg by mouth daily.       telmisartan-hydrochlorothiazide (MICARDIS HCT) 80-12.5 mg per tablet Take 1 Tab by mouth daily.              Activity:  As tolerated   Diet:  Cardiac     Follow-up with Hartley BarefootLaurie A Goldsticker, MD  7 days    Nichele Slawson Y. Sharion DoveZapata, MD  January 25, 2015

## 2015-01-27 NOTE — Other (Addendum)
----------  DocumentID:   [id number]------------------------------------------------              [facility]                       Patient Education Report         Name: [name], [name]                  Date: 01/20/2015    MRN: [id number]                    Time: 2:45:38 AM         Patient ordered video: Patient Safety: Stay Safe While you are in the   Hospital    from [email or web address] via phone number: [phone] at 2:45:38 AM    ----------DocumentID:   [id number]------------------------------------------------              [facility]                       Patient Education Report         Name: [name], [name]                  Date: 01/20/2015    MRN: [id number]                    Time: 2:45:38 AM         Patient ordered video: Patient Safety: Stay Safe While you are in the   Hospital    from [phone] via phone number: [phone] at 2:45:38 AM

## 2015-03-01 ENCOUNTER — Encounter

## 2015-03-10 ENCOUNTER — Inpatient Hospital Stay: Admit: 2015-03-10 | Payer: MEDICARE | Attending: Gastroenterology | Primary: Family Medicine

## 2015-03-10 DIAGNOSIS — K5669 Other intestinal obstruction: Secondary | ICD-10-CM

## 2015-03-10 MED ORDER — GADOPENTETATE DIMEGLUMINE 469.01 MG/ML (46.9 %) IV SOLN
469.01 mg/mL (46.9 %) | Freq: Once | INTRAVENOUS | Status: AC
Start: 2015-03-10 — End: 2015-03-10
  Administered 2015-03-10: 14:00:00 via INTRAVENOUS

## 2015-03-10 MED FILL — MAGNEVIST 469.01 MG/ML (46.9 %) INTRAVENOUS SOLUTION: 469.01 mg/mL (46.9 %) | INTRAVENOUS | Qty: 14

## 2015-07-05 ENCOUNTER — Encounter

## 2015-07-05 ENCOUNTER — Inpatient Hospital Stay: Admit: 2015-07-05 | Payer: MEDICARE | Primary: Family Medicine

## 2015-07-05 DIAGNOSIS — M549 Dorsalgia, unspecified: Secondary | ICD-10-CM

## 2016-09-07 ENCOUNTER — Encounter: Payer: Self-pay | Admitting: *Deleted

## 2016-09-07 ENCOUNTER — Emergency Department
Admission: EM | Admit: 2016-09-07 | Discharge: 2016-09-07 | Disposition: A | Discharge status: Home / Self Care | Payer: Medicare Other | Attending: Emergency Medicine | Admitting: Emergency Medicine

## 2016-09-07 ENCOUNTER — Emergency Department: Payer: Medicare Other

## 2016-09-07 DIAGNOSIS — F329 Major depressive disorder, single episode, unspecified: Secondary | ICD-10-CM | POA: Insufficient documentation

## 2016-09-07 DIAGNOSIS — Z046 Encounter for general psychiatric examination, requested by authority: Secondary | ICD-10-CM | POA: Diagnosis present

## 2016-09-07 DIAGNOSIS — F0391 Unspecified dementia with behavioral disturbance: Secondary | ICD-10-CM | POA: Diagnosis not present

## 2016-09-07 DIAGNOSIS — I10 Essential (primary) hypertension: Secondary | ICD-10-CM | POA: Insufficient documentation

## 2016-09-07 DIAGNOSIS — Z5181 Encounter for therapeutic drug level monitoring: Secondary | ICD-10-CM | POA: Insufficient documentation

## 2016-09-07 DIAGNOSIS — J45909 Unspecified asthma, uncomplicated: Secondary | ICD-10-CM | POA: Diagnosis not present

## 2016-09-07 DIAGNOSIS — E860 Dehydration: Secondary | ICD-10-CM | POA: Insufficient documentation

## 2016-09-07 DIAGNOSIS — F0281 Dementia in other diseases classified elsewhere with behavioral disturbance: Secondary | ICD-10-CM | POA: Diagnosis not present

## 2016-09-07 DIAGNOSIS — F0150 Vascular dementia without behavioral disturbance: Secondary | ICD-10-CM

## 2016-09-07 DIAGNOSIS — F0390 Unspecified dementia without behavioral disturbance: Secondary | ICD-10-CM

## 2016-09-07 HISTORY — DX: Unspecified dementia, unspecified severity, without behavioral disturbance, psychotic disturbance, mood disturbance, and anxiety: F03.90

## 2016-09-07 HISTORY — DX: Unspecified asthma, uncomplicated: J45.909

## 2016-09-07 HISTORY — DX: Essential (primary) hypertension: I10

## 2016-09-07 HISTORY — DX: Unspecified osteoarthritis, unspecified site: M19.90

## 2016-09-07 LAB — COMPREHENSIVE METABOLIC PANEL
ALT: 14 U/L (ref 14–54)
AST: 29 U/L (ref 15–41)
Albumin: 4.3 g/dL (ref 3.5–5.0)
Alkaline Phosphatase: 91 U/L (ref 38–126)
Anion gap: 11 (ref 5–15)
BUN: 32 mg/dL — AB (ref 6–20)
CHLORIDE: 106 mmol/L (ref 101–111)
CO2: 25 mmol/L (ref 22–32)
Calcium: 10.3 mg/dL (ref 8.9–10.3)
Creatinine, Ser: 1.23 mg/dL — ABNORMAL HIGH (ref 0.44–1.00)
GFR, EST AFRICAN AMERICAN: 48 mL/min — AB (ref >60)
GFR, EST NON AFRICAN AMERICAN: 41 mL/min — AB (ref >60)
Glucose, Bld: 114 mg/dL — ABNORMAL HIGH (ref 65–99)
POTASSIUM: 3.3 mmol/L — AB (ref 3.5–5.1)
Sodium: 142 mmol/L (ref 135–145)
Total Bilirubin: 0.9 mg/dL (ref 0.3–1.2)
Total Protein: 8.3 g/dL — ABNORMAL HIGH (ref 6.5–8.1)

## 2016-09-07 LAB — CBC WITH DIFFERENTIAL/PLATELET
BASOS ABS: 0.1 10*3/uL (ref 0–0.1)
Basophils Relative: 1 %
EOS PCT: 1 %
Eosinophils Absolute: 0.1 10*3/uL (ref 0–0.7)
HCT: 42.3 % (ref 35.0–47.0)
Hemoglobin: 14.1 g/dL (ref 12.0–16.0)
LYMPHS PCT: 30 %
Lymphs Abs: 3.4 10*3/uL (ref 1.0–3.6)
MCH: 31.5 pg (ref 26.0–34.0)
MCHC: 33.4 g/dL (ref 32.0–36.0)
MCV: 94.3 fL (ref 80.0–100.0)
MONO ABS: 0.8 10*3/uL (ref 0.2–0.9)
Monocytes Relative: 7 %
Neutro Abs: 7.1 10*3/uL — ABNORMAL HIGH (ref 1.4–6.5)
Neutrophils Relative %: 61 %
PLATELETS: 209 10*3/uL (ref 150–440)
RBC: 4.49 MIL/uL (ref 3.80–5.20)
RDW: 13.5 % (ref 11.5–14.5)
WBC: 11.4 10*3/uL — ABNORMAL HIGH (ref 3.6–11.0)

## 2016-09-07 LAB — URINE DRUG SCREEN, QUALITATIVE (ARMC ONLY)
AMPHETAMINES, UR SCREEN: NOT DETECTED
Barbiturates, Ur Screen: NOT DETECTED
Benzodiazepine, Ur Scrn: NOT DETECTED
COCAINE METABOLITE, UR ~~LOC~~: NOT DETECTED
Cannabinoid 50 Ng, Ur ~~LOC~~: NOT DETECTED
MDMA (Ecstasy)Ur Screen: NOT DETECTED
METHADONE SCREEN, URINE: NOT DETECTED
OPIATE, UR SCREEN: NOT DETECTED
Phencyclidine (PCP) Ur S: NOT DETECTED
Tricyclic, Ur Screen: NOT DETECTED

## 2016-09-07 LAB — URINALYSIS COMPLETE WITH MICROSCOPIC (ARMC ONLY)
BILIRUBIN URINE: NEGATIVE
Bacteria, UA: NONE SEEN
GLUCOSE, UA: NEGATIVE mg/dL
Hgb urine dipstick: NEGATIVE
Leukocytes, UA: NEGATIVE
Nitrite: NEGATIVE
Protein, ur: 30 mg/dL — AB
Specific Gravity, Urine: 1.02 (ref 1.005–1.030)
pH: 5 (ref 5.0–8.0)

## 2016-09-07 LAB — ETHANOL

## 2016-09-07 LAB — SALICYLATE LEVEL: Salicylate Lvl: <7 mg/dL (ref 2.8–30.0)

## 2016-09-07 LAB — ACETAMINOPHEN LEVEL

## 2016-09-07 LAB — TROPONIN I
TROPONIN I: 0.03 ng/mL — AB (ref <0.03)
Troponin I: 0.03 ng/mL (ref <0.03)

## 2016-09-07 MED ORDER — ACETAMINOPHEN 325 MG PO TABS
ORAL_TABLET | ORAL | Status: AC
  Administered 2016-09-07: 650 mg via ORAL
  Filled 2016-09-07: qty 2

## 2016-09-07 MED ORDER — ACETAMINOPHEN 325 MG PO TABS
650.0000 mg | ORAL_TABLET | Freq: Once | ORAL | Status: AC
  Administered 2016-09-07: 650 mg via ORAL

## 2016-09-07 MED ORDER — SODIUM CHLORIDE 0.9 % IV BOLUS (SEPSIS)
1000.0000 mL | Freq: Once | INTRAVENOUS | Status: AC
  Administered 2016-09-07: 1000 mL via INTRAVENOUS

## 2016-09-07 NOTE — ED Notes (Signed)
Patient transported to CT 

## 2016-09-07 NOTE — ED Notes (Signed)
Called for breakfast tray.  

## 2016-09-07 NOTE — ED Notes (Signed)
Officer Pride found location of patient's car and gave instructions to Shelly Anthony to the car.

## 2016-09-07 NOTE — ED Provider Notes (Signed)
Cleared for discharge by psychiatrist SOC.   Jene Everyobert Shemika Robbs, MD 09/07/16 1330
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
## 2016-09-07 NOTE — ED Notes (Signed)
Pt was sleeping at this time no distress noted.

## 2016-09-07 NOTE — ED Notes (Signed)
Pt c/o back pain from lying in stretcher. Pt sitting in recliner provided to her by this RN at this time. Given cup of water. Pt watching TV. [facility]

## 2016-09-07 NOTE — ED Notes (Signed)
Pt given more water and warm blankets.

## 2016-09-07 NOTE — ED Notes (Signed)
I spoke with Byrd HesselbachMaria, patient's daughter, who states having work done at her house and planning on picking up patient around 2000.

## 2016-09-07 NOTE — Discharge Instructions (Signed)
Patient needs follow up with her PCP to further evaluate likely early dementia

## 2016-09-07 NOTE — ED Notes (Signed)
Attempted IV at x2 unsuccessful.
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
## 2016-09-07 NOTE — ED Notes (Signed)
ED Provider at bedside. 

## 2016-09-07 NOTE — ED Notes (Signed)
Pt daughter, Byrd HesselbachMaria contacted and states that she will be here to get patient at approx 4PM.
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
## 2016-09-07 NOTE — ED Notes (Signed)
Pt given sandwich tray and water 

## 2016-09-07 NOTE — ED Notes (Addendum)
Daughter number [phone] Name is Shelly HesselbachMaria

## 2016-09-07 NOTE — ED Notes (Signed)
Pt eating lunch tray at this time  

## 2016-09-07 NOTE — ED Triage Notes (Signed)
Pt brought to ED by police. Pt had a silver alert placed upon her when she went missing from her home in [place], [place]. Pt is very pleasant and cooperative, but is confused. Pt does not know where she is presently but stated that she leaves from her home when she gets mad at [facility], her daughter. Pt has been reported to have driven herself to [place]. Pt denies eating and drinking over past 3 days. Pt is A & O x 1

## 2016-09-07 NOTE — ED Notes (Signed)
Pt noted to be talking to someone name thomas. Pt states did you see my son Maisie Fushomas. Pt states he was behind me.  When asked how old is Maisie Fushomas? Pt states " How old are you Maisie Fushomas." the she states he is [age]years old. Pt reports he has been here a week. Dr. Dolores FrameSung made aware.

## 2016-09-07 NOTE — ED Notes (Signed)
[facility] at [facility].

## 2016-09-07 NOTE — ED Notes (Signed)
Attempted to call Shelly Anthony with no answer.

## 2017-11-19 IMAGING — CR DG CHEST 2V
2 series · 2 of 2 positions shown · non-contrast
Comparison: None.

CLINICAL DATA: Acute onset of confusion.  Initial encounter.

EXAM:
CHEST  2 VIEW

[chest lat]
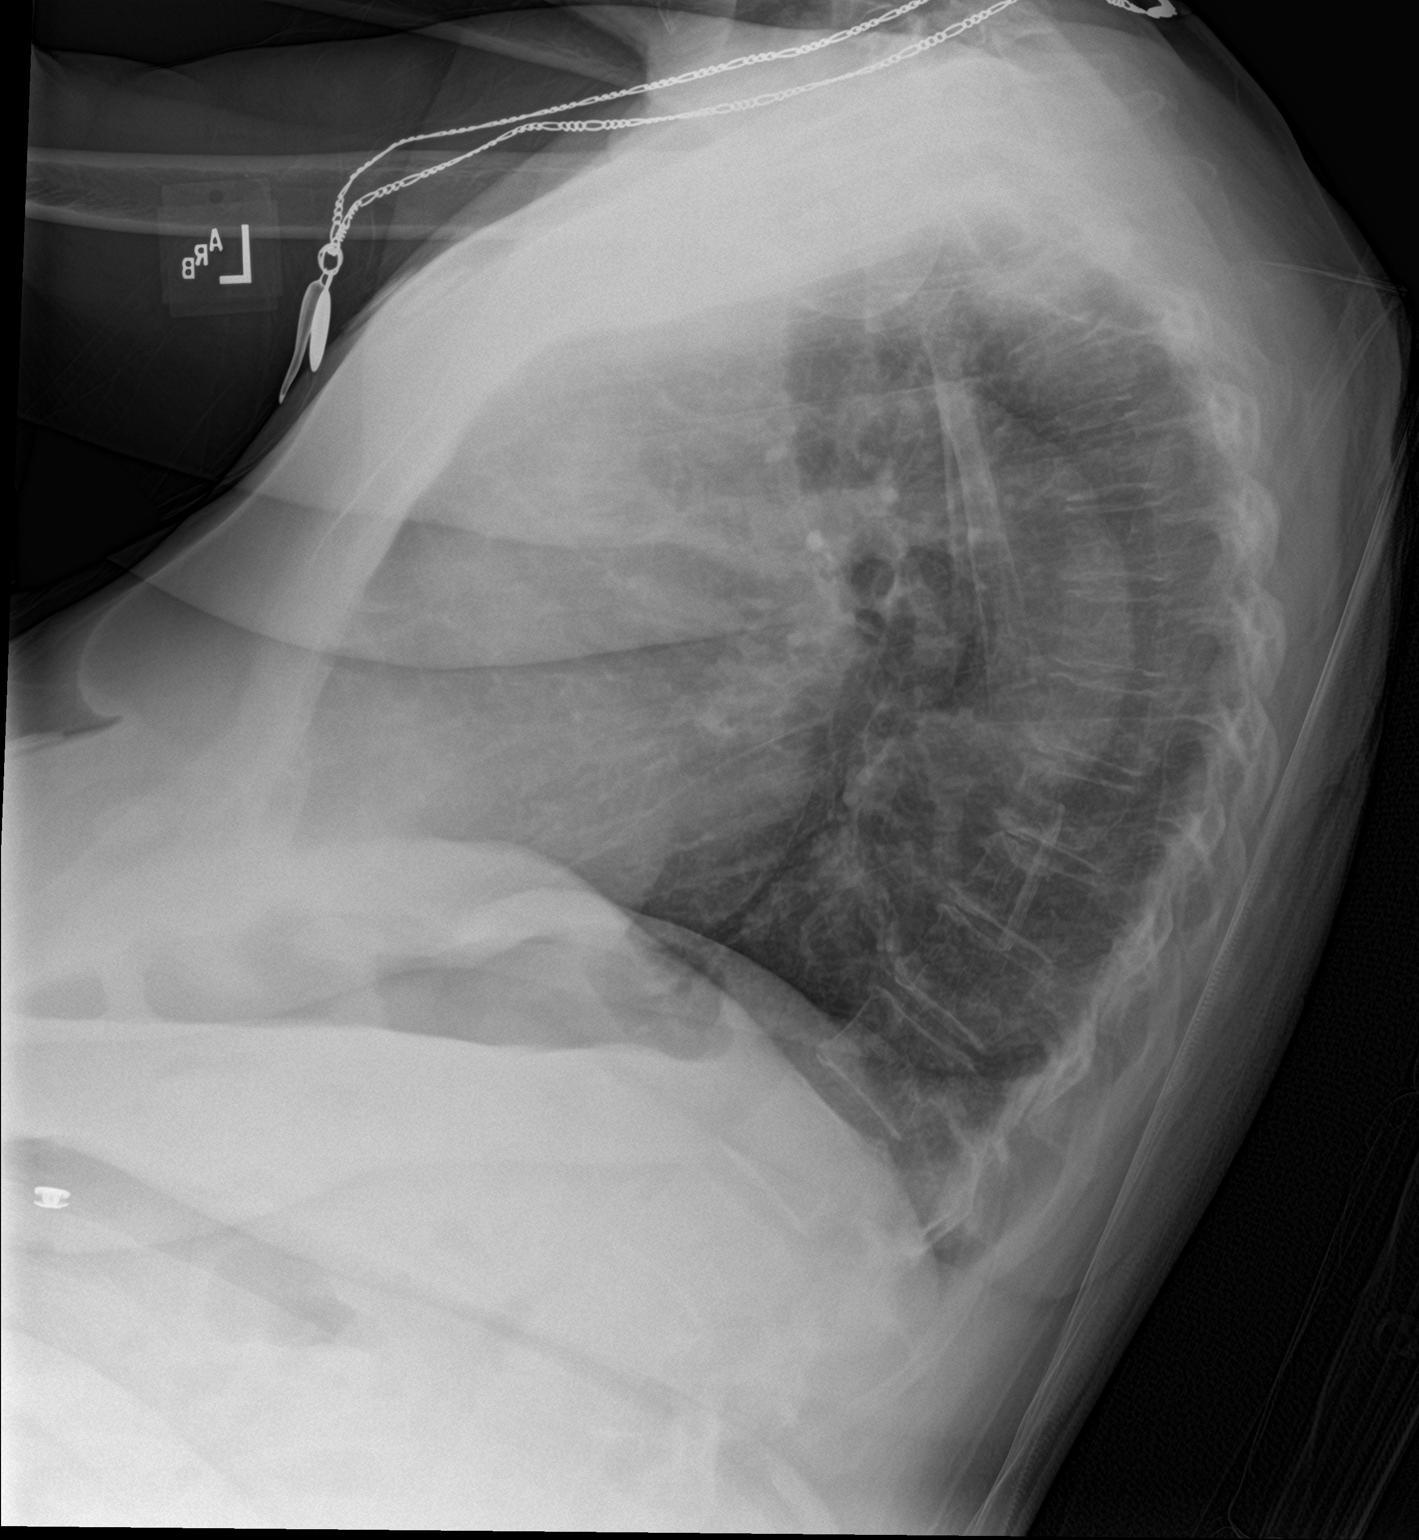

[chest ap]
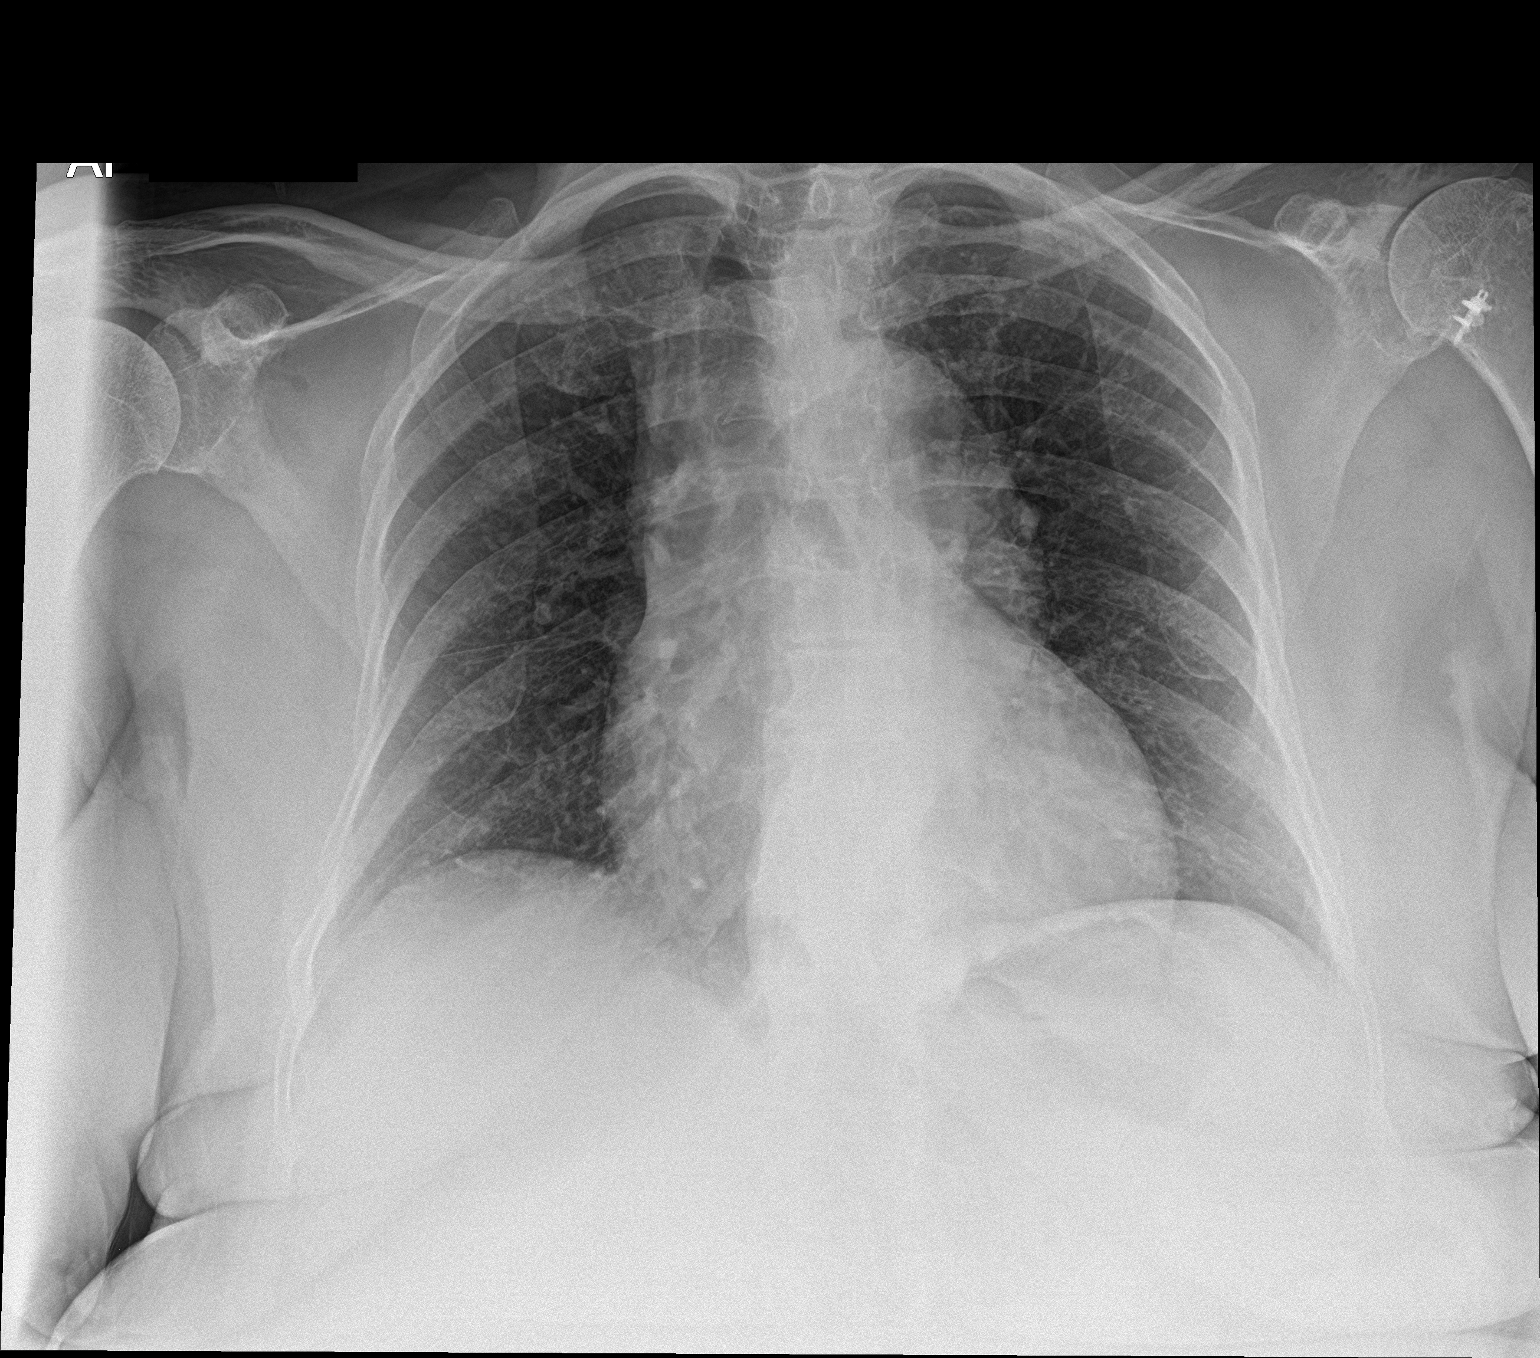

[2 of 2 positions shown; findings below may reference images not displayed]

FINDINGS: The lungs are well-aerated and clear. There is no evidence of focal
opacification, pleural effusion or pneumothorax.

The heart is borderline normal in size. No acute osseous
abnormalities are seen. Postoperative change is noted at the left
humeral head.
IMPRESSION: No acute cardiopulmonary process seen.

## 2022-08-07 DEATH — deceased
